# Patient Record
Sex: Female | Born: 2000 | Race: White | Hispanic: No | Smoking: Never smoker
Health system: Southern US, Community
[De-identification: ages and names within clinical notes are randomized; demographics above are authoritative.]

## PROBLEM LIST (undated history)

## (undated) DIAGNOSIS — T8859XA Other complications of anesthesia, initial encounter: Secondary | ICD-10-CM

## (undated) DIAGNOSIS — F32A Depression, unspecified: Secondary | ICD-10-CM

## (undated) DIAGNOSIS — F419 Anxiety disorder, unspecified: Secondary | ICD-10-CM

## (undated) DIAGNOSIS — J069 Acute upper respiratory infection, unspecified: Secondary | ICD-10-CM

## (undated) DIAGNOSIS — F4310 Post-traumatic stress disorder, unspecified: Secondary | ICD-10-CM

## (undated) DIAGNOSIS — D649 Anemia, unspecified: Secondary | ICD-10-CM

## (undated) DIAGNOSIS — L509 Urticaria, unspecified: Secondary | ICD-10-CM

## (undated) DIAGNOSIS — K219 Gastro-esophageal reflux disease without esophagitis: Secondary | ICD-10-CM

## (undated) DIAGNOSIS — F39 Unspecified mood [affective] disorder: Secondary | ICD-10-CM

## (undated) DIAGNOSIS — Z8489 Family history of other specified conditions: Secondary | ICD-10-CM

## (undated) DIAGNOSIS — F909 Attention-deficit hyperactivity disorder, unspecified type: Secondary | ICD-10-CM

## (undated) DIAGNOSIS — Z801 Family history of malignant neoplasm of trachea, bronchus and lung: Secondary | ICD-10-CM

## (undated) DIAGNOSIS — R112 Nausea with vomiting, unspecified: Secondary | ICD-10-CM

## (undated) DIAGNOSIS — J45909 Unspecified asthma, uncomplicated: Secondary | ICD-10-CM

## (undated) DIAGNOSIS — J020 Streptococcal pharyngitis: Secondary | ICD-10-CM

## (undated) DIAGNOSIS — H539 Unspecified visual disturbance: Secondary | ICD-10-CM

## (undated) DIAGNOSIS — Z9889 Other specified postprocedural states: Secondary | ICD-10-CM

## (undated) DIAGNOSIS — Z8669 Personal history of other diseases of the nervous system and sense organs: Secondary | ICD-10-CM

## (undated) DIAGNOSIS — R062 Wheezing: Secondary | ICD-10-CM

## (undated) DIAGNOSIS — Z8051 Family history of malignant neoplasm of kidney: Secondary | ICD-10-CM

## (undated) DIAGNOSIS — E039 Hypothyroidism, unspecified: Secondary | ICD-10-CM

## (undated) DIAGNOSIS — K259 Gastric ulcer, unspecified as acute or chronic, without hemorrhage or perforation: Secondary | ICD-10-CM

## (undated) DIAGNOSIS — R110 Nausea: Secondary | ICD-10-CM

## (undated) DIAGNOSIS — Z8049 Family history of malignant neoplasm of other genital organs: Secondary | ICD-10-CM

## (undated) DIAGNOSIS — Q7962 Hypermobile Ehlers-Danlos syndrome: Secondary | ICD-10-CM

## (undated) DIAGNOSIS — T7840XA Allergy, unspecified, initial encounter: Secondary | ICD-10-CM

## (undated) DIAGNOSIS — E042 Nontoxic multinodular goiter: Secondary | ICD-10-CM

## (undated) DIAGNOSIS — J45991 Cough variant asthma: Secondary | ICD-10-CM

## (undated) DIAGNOSIS — E669 Obesity, unspecified: Secondary | ICD-10-CM

## (undated) DIAGNOSIS — N83209 Unspecified ovarian cyst, unspecified side: Secondary | ICD-10-CM

## (undated) HISTORY — DX: Streptococcal pharyngitis: J02.0

## (undated) HISTORY — DX: Personal history of other diseases of the nervous system and sense organs: Z86.69

## (undated) HISTORY — DX: Cough variant asthma: J45.991

## (undated) HISTORY — DX: Family history of malignant neoplasm of other genital organs: Z80.49

## (undated) HISTORY — DX: Allergy, unspecified, initial encounter: T78.40XA

## (undated) HISTORY — PX: ABCESS DRAINAGE: SHX399

## (undated) HISTORY — DX: Family history of malignant neoplasm of trachea, bronchus and lung: Z80.1

## (undated) HISTORY — DX: Acute upper respiratory infection, unspecified: J06.9

## (undated) HISTORY — DX: Attention-deficit hyperactivity disorder, unspecified type: F90.9

## (undated) HISTORY — DX: Wheezing: R06.2

## (undated) HISTORY — DX: Family history of malignant neoplasm of kidney: Z80.51

## (undated) HISTORY — DX: Urticaria, unspecified: L50.9

---
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
## 2020-01-10 ENCOUNTER — Encounter (INDEPENDENT_AMBULATORY_CARE_PROVIDER_SITE_OTHER): Payer: Self-pay

## 2020-01-18 ENCOUNTER — Ambulatory Visit
Admission: RE | Admit: 2020-01-18 | Discharge: 2020-01-18 | Disposition: A | Discharge status: Home / Self Care | Payer: No Typology Code available for payment source | Source: Ambulatory Visit | Attending: Pediatric Endocrinology | Admitting: Pediatric Endocrinology

## 2020-01-18 ENCOUNTER — Other Ambulatory Visit (INDEPENDENT_AMBULATORY_CARE_PROVIDER_SITE_OTHER): Payer: Self-pay | Admitting: Pediatric Endocrinology

## 2020-01-18 ENCOUNTER — Encounter (INDEPENDENT_AMBULATORY_CARE_PROVIDER_SITE_OTHER): Payer: Self-pay

## 2020-01-18 DIAGNOSIS — N838 Other noninflammatory disorders of ovary, fallopian tube and broad ligament: Secondary | ICD-10-CM | POA: Diagnosis not present

## 2020-01-18 DIAGNOSIS — D270 Benign neoplasm of right ovary: Secondary | ICD-10-CM

## 2020-01-18 DIAGNOSIS — N83201 Unspecified ovarian cyst, right side: Secondary | ICD-10-CM | POA: Diagnosis not present

## 2020-01-18 DIAGNOSIS — N83202 Unspecified ovarian cyst, left side: Secondary | ICD-10-CM

## 2020-01-18 DIAGNOSIS — N83292 Other ovarian cyst, left side: Secondary | ICD-10-CM | POA: Diagnosis not present

## 2020-01-18 NOTE — Telephone Encounter (Signed)
Can you please follow up on the referral I sent last week? Thanks

## 2020-01-19 ENCOUNTER — Other Ambulatory Visit (INDEPENDENT_AMBULATORY_CARE_PROVIDER_SITE_OTHER): Payer: Self-pay | Admitting: Pediatric Endocrinology

## 2020-01-19 ENCOUNTER — Encounter (INDEPENDENT_AMBULATORY_CARE_PROVIDER_SITE_OTHER): Payer: Self-pay

## 2020-01-19 DIAGNOSIS — N9489 Other specified conditions associated with female genital organs and menstrual cycle: Secondary | ICD-10-CM

## 2020-01-19 NOTE — Progress Notes (Signed)
Follow up u/s of the Pelvis-  Good resolution of hemorrhagic cyst Rapid progression of right adnexal mass from incidental finding 6 weeks ago to significant effacement of ovary  MRI Pelvis ordered for evaluation.  She is scheduled to see Dr. Kennon Rounds in 3 weeks- hopefully will be able to have MRI done prior to that visit.

## 2020-01-24 ENCOUNTER — Encounter (INDEPENDENT_AMBULATORY_CARE_PROVIDER_SITE_OTHER): Payer: Self-pay

## 2020-01-26 ENCOUNTER — Encounter (INDEPENDENT_AMBULATORY_CARE_PROVIDER_SITE_OTHER): Payer: Self-pay

## 2020-01-27 ENCOUNTER — Encounter (INDEPENDENT_AMBULATORY_CARE_PROVIDER_SITE_OTHER): Payer: Self-pay

## 2020-01-27 ENCOUNTER — Telehealth (INDEPENDENT_AMBULATORY_CARE_PROVIDER_SITE_OTHER): Payer: Self-pay | Admitting: Pediatric Endocrinology

## 2020-01-27 NOTE — Telephone Encounter (Signed)
Who's calling (name and relationship to patient) : [facility]  Best contact number: [phone] ext [phone]  Provider they see: Dr. Baldo Ash  Reason for call:  Nira Conn called in stating that per [facility] a PA is in fact needed for the MRI with and without contrast. States that if pt had [facility] then there would not be a need for the PA but after confirmation with Payer Chat it does require that. CPT code for reference is [phone]. PA is needed today to ensure PT stays on schedule for MRI that is scheduled for tomorrow 8/14, if not will have to be rescheduled for later time. Requesting call back at [phone] ext [phone].   Call ID:      PRESCRIPTION REFILL ONLY  Name of prescription:  Pharmacy:

## 2020-01-27 NOTE — Telephone Encounter (Signed)
Contacted patient insurance to get it authorized over the phone as we do not have access to the online portal required for this insurance.   Prior authorization for this insurance must be done by fax if unable to do so through the online portal. Given the fax number [phone] and instructed to write Expedited on the cover sheet.   attatched to the fax was the order for CPT 72197 with Dx code [phone] Mass of Uterine Adnexa. Patient's recent Ultrasound and the provider results from it, previous visit note, and a letter requesting it be expedited with provider NPI and tax ID the imaging centers NPI and tax ID, and a number to contact if they need any further information.   As this is an expedited request will follow up with the authorization again later today so in imaging center can be updated.

## 2020-01-30 ENCOUNTER — Ambulatory Visit (HOSPITAL_COMMUNITY): Admission: RE | Admit: 2020-01-30 | Payer: BLUE CROSS/BLUE SHIELD | Source: Ambulatory Visit

## 2020-02-02 ENCOUNTER — Ambulatory Visit (HOSPITAL_COMMUNITY)
Admission: RE | Admit: 2020-02-02 | Discharge: 2020-02-02 | Disposition: A | Discharge status: Home / Self Care | Payer: BLUE CROSS/BLUE SHIELD | Source: Ambulatory Visit | Attending: Pediatric Endocrinology | Admitting: Pediatric Endocrinology

## 2020-02-02 ENCOUNTER — Telehealth (INDEPENDENT_AMBULATORY_CARE_PROVIDER_SITE_OTHER): Payer: Self-pay | Admitting: Pediatric Endocrinology

## 2020-02-02 ENCOUNTER — Telehealth (INDEPENDENT_AMBULATORY_CARE_PROVIDER_SITE_OTHER): Payer: BLUE CROSS/BLUE SHIELD | Admitting: Lactation Services

## 2020-02-02 ENCOUNTER — Other Ambulatory Visit: Payer: Self-pay

## 2020-02-02 DIAGNOSIS — N9489 Other specified conditions associated with female genital organs and menstrual cycle: Secondary | ICD-10-CM | POA: Diagnosis not present

## 2020-02-02 DIAGNOSIS — N946 Dysmenorrhea, unspecified: Secondary | ICD-10-CM

## 2020-02-02 DIAGNOSIS — R1909 Other intra-abdominal and pelvic swelling, mass and lump: Secondary | ICD-10-CM | POA: Diagnosis not present

## 2020-02-02 DIAGNOSIS — N83291 Other ovarian cyst, right side: Secondary | ICD-10-CM | POA: Diagnosis not present

## 2020-02-02 MED ORDER — GADOBENATE DIMEGLUMINE 529 MG/ML IV SOLN
10.0000 mL | Freq: Once | INTRAVENOUS | Status: AC | PRN
  Administered 2020-02-02: 10 mL via INTRAVENOUS

## 2020-02-02 NOTE — Telephone Encounter (Signed)
  Who's calling (name and relationship to patient) : Dr. Virginia Crews office  Best contact number: [phone]  Provider they see: Dr. Baldo Ash  Reason for call: Dr. Virginia Crews office left a voicemail wanting to let you know that patient has an appointment in their OBGYN office on 8/25 at 2:15.    PRESCRIPTION REFILL ONLY  Name of prescription:  Pharmacy:

## 2020-02-02 NOTE — Telephone Encounter (Signed)
Roselyn Reef, CMA at Dr. Montey Hora office called to ask when patient will be scheduled in the office. patient is scheduled for an appointment on 8/25 @ 10:15 with Dr. Kennon Rounds.   Called Dr. Montey Hora office and left message with referral coordinator that pt has appt scheduled.   Spoke with Heidi [name], [name]'s mom and she reports she is aware of the appointment next week.

## 2020-02-07 ENCOUNTER — Encounter (INDEPENDENT_AMBULATORY_CARE_PROVIDER_SITE_OTHER): Payer: Self-pay

## 2020-02-08 ENCOUNTER — Other Ambulatory Visit: Payer: Self-pay

## 2020-02-08 ENCOUNTER — Encounter (INDEPENDENT_AMBULATORY_CARE_PROVIDER_SITE_OTHER): Payer: Self-pay

## 2020-02-08 ENCOUNTER — Ambulatory Visit (INDEPENDENT_AMBULATORY_CARE_PROVIDER_SITE_OTHER): Payer: BLUE CROSS/BLUE SHIELD | Admitting: Family Medicine

## 2020-02-08 ENCOUNTER — Encounter: Payer: Self-pay | Admitting: Family Medicine

## 2020-02-08 VITALS — BP 111/75 | HR 103 | Ht 64.0 in | Wt 292.0 lb

## 2020-02-08 DIAGNOSIS — F640 Transsexualism: Secondary | ICD-10-CM | POA: Diagnosis not present

## 2020-02-08 DIAGNOSIS — N946 Dysmenorrhea, unspecified: Secondary | ICD-10-CM

## 2020-02-08 DIAGNOSIS — R102 Pelvic and perineal pain: Secondary | ICD-10-CM | POA: Diagnosis not present

## 2020-02-08 NOTE — Assessment & Plan Note (Addendum)
Patient at this time is not transgender and has potentially desire for children in the future.  Initially there was a discussion around surgical removal of both ovaries and they and their mother were considering oocyte cryo-preservation if needed.  Patient has an indwelling GnRH agonist which suppresses their ovarian function at this time.  They might be interested in testosterone supplementation as well.  Will reach out to their endocrinologist for consideration of long-term plans and bone preservation.

## 2020-02-08 NOTE — Progress Notes (Signed)
Subjective:    Patient ID: Heidi [name] is a 19 y.o. adult presenting with Follow-up  on 02/08/2020  HPI: Has history of hemorrhagic cyst initially noted in May 2020.  There is a moderate amount of free fluid noted at that time as well.  This hemorrhagic cyst was resolved by December 09, 2018.  Second hemorrhagic cyst noted in June 2021 which resolved in August 2021.  There was a question of a mass noted in the left adnexa and so an MRI was subsequently obtained.  This MRI failed to show a pelvic mass and that was thought to be overlying sigmoid colon.  There was a very small paratubal cyst noted measuring 1.1 cm.  Heidi [name] has noticed worsening pelvic pain which is not cyclical and is present every day.  It is worse with any type of movement.  Heidi [name] also has a implanted Abanda agonist and since November 2020.  Review of Systems  Constitutional: Negative for chills and fever.  Respiratory: Negative for shortness of breath.   Cardiovascular: Negative for chest pain.  Gastrointestinal: Negative for abdominal pain, nausea and vomiting.  Genitourinary: Negative for dysuria.  Skin: Negative for rash.      Objective:    BP 111/75   Pulse (!) 103   Ht 5\' 4"  (1.626 m)   Wt 292 lb (132.5 kg)   BMI 50.12 kg/m  Physical Exam Exam conducted with a chaperone present.  Constitutional:      General: Heidi [name] "Heidi [name]" is not in acute distress.    Appearance: Heidi [name] "Heidi [name]" is well-developed.  HENT:     Head: Normocephalic and atraumatic.  Eyes:     General: No scleral icterus. Cardiovascular:     Rate and Rhythm: Normal rate.  Pulmonary:     Effort: Pulmonary effort is normal.  Abdominal:     Palpations: Abdomen is soft.  Genitourinary:    Comments: Vagina is atrophic, uterus is markedly small, ovaries cannot be palpated there is no real pelvic tenderness on exam, exam is limited by body habitus Musculoskeletal:     Cervical back: Neck supple.  Skin:    General: Skin is warm and dry.    Neurological:     Mental Status: Heidi [name] "Heidi [name]" is alert and oriented to person, place, and time.         Assessment & Plan:   Problem List Items Addressed This Visit      Unprioritized   Dysmenorrhea in adolescent    And menorrhagia when their ovaries are allowed to function on her own.  Has already been on combined oral contraceptives, Nexplanon to no avail.      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  Initially there was a discussion around surgical removal of both ovaries and they and their mother were considering oocyte cryo-preservation if needed.  Patient has an indwelling GnRH agonist which suppresses their ovarian function at this time.  They might be interested in testosterone supplementation as well.  Will reach out to their endocrinologist for consideration of long-term plans and bone preservation.      Pelvic pain - Primary    This is extremely unusual for a paratubal cyst to cause pain.  Given that we are suppressing their normal ovarian function to the point of having no menses times greater than 1 year, it seems unlikely that they would have some sort of endometriosis  or other chronic pelvic pain type condition as a reason for their pain.  Given all of this, I have referred them to urology for possible interstitial cystitis work-up. If this fails to identify the cause of their pain, would consider diagnostic laparoscopy with removal of paratubal cyst and evaluation of ovaries at that time.      Relevant Orders   Ambulatory referral to Urology     Total time in review of prior notes, pathology, labs, history taking, review with patient, exam, note writing, discussion of options, plan for next steps, alternatives and risks of treatment: 81 minutes.  Return in about 3 months (around 05/10/2020) for a follow-up, referral to urology.  Heidi [name] 02/08/2020 4:27  PM

## 2020-02-08 NOTE — Progress Notes (Signed)
Assisted Dr. Kennon Rounds with Manual Exam-02/08/20@ 11: 20a

## 2020-02-08 NOTE — Assessment & Plan Note (Addendum)
This is extremely unusual for a paratubal cyst to cause pain.  Given that we are suppressing their normal ovarian function to the point of having no menses times greater than 1 year, it seems unlikely that they would have some sort of endometriosis or other chronic pelvic pain type condition as a reason for their pain.  Given all of this, I have referred them to urology for possible interstitial cystitis work-up. If this fails to identify the cause of their pain, would consider diagnostic laparoscopy with removal of paratubal cyst and evaluation of ovaries at that time.

## 2020-02-08 NOTE — Assessment & Plan Note (Signed)
And menorrhagia when their ovaries are allowed to function on her own.  Has already been on combined oral contraceptives, Nexplanon to no avail.

## 2020-02-19 DIAGNOSIS — M7989 Other specified soft tissue disorders: Secondary | ICD-10-CM | POA: Diagnosis not present

## 2020-02-19 DIAGNOSIS — S61451A Open bite of right hand, initial encounter: Secondary | ICD-10-CM | POA: Diagnosis not present

## 2020-02-19 DIAGNOSIS — W540XXA Bitten by dog, initial encounter: Secondary | ICD-10-CM | POA: Diagnosis not present

## 2020-02-19 IMAGING — US US PELVIS COMPLETE WITH TRANSVAGINAL
2 series · 13 of 25 positions shown · non-contrast
Comparison: None

CLINICAL DATA: Initial evaluation for primary amenorrhea.



[Series 1: us pelvis complete with transvaginal · 0.28mm/px · 59 acquisitions, 12 frames shown (1 of 2)]
[im 1/59]
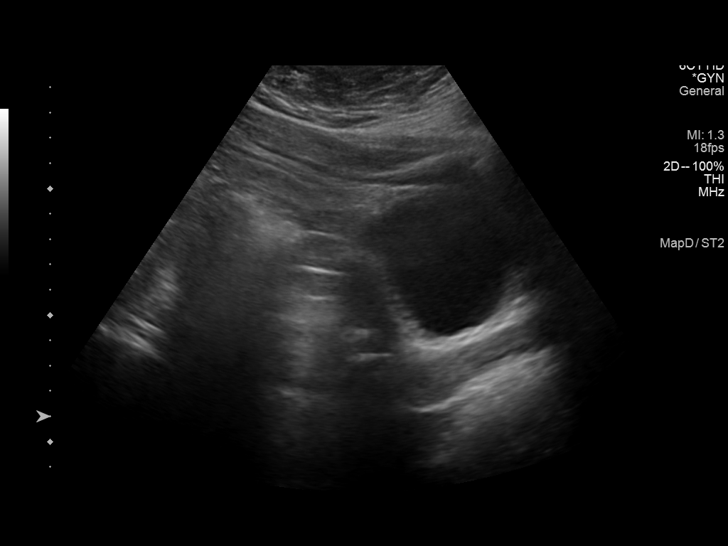
[im 6/59]
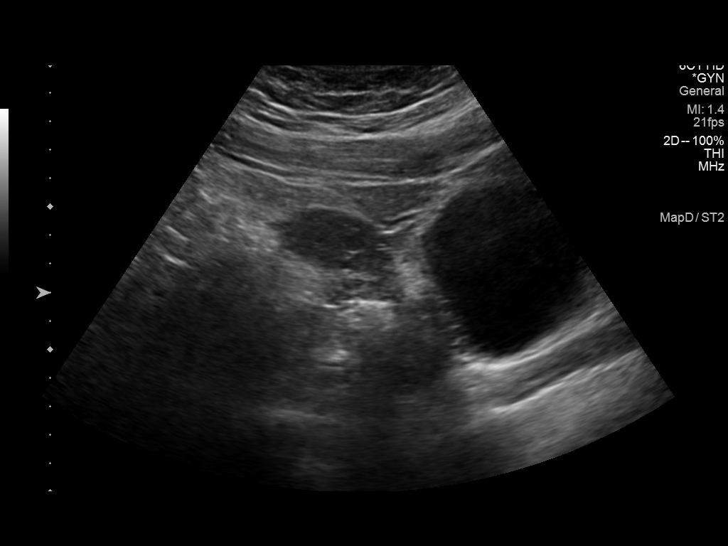
[im 11/59]
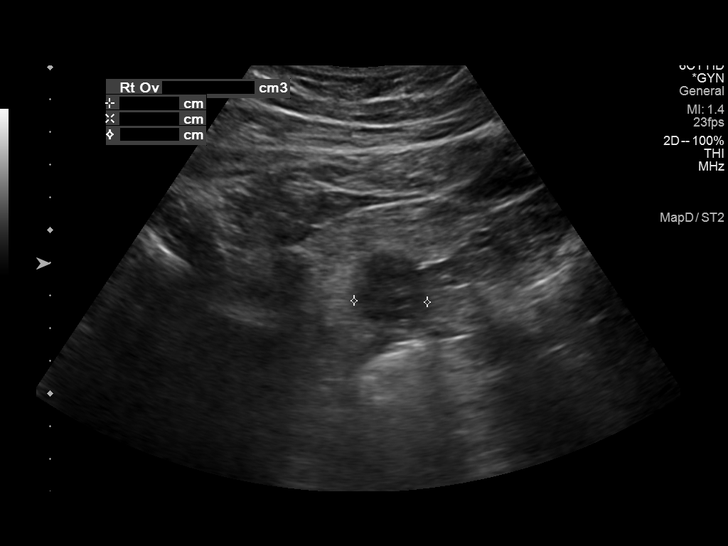
[im 16/59]
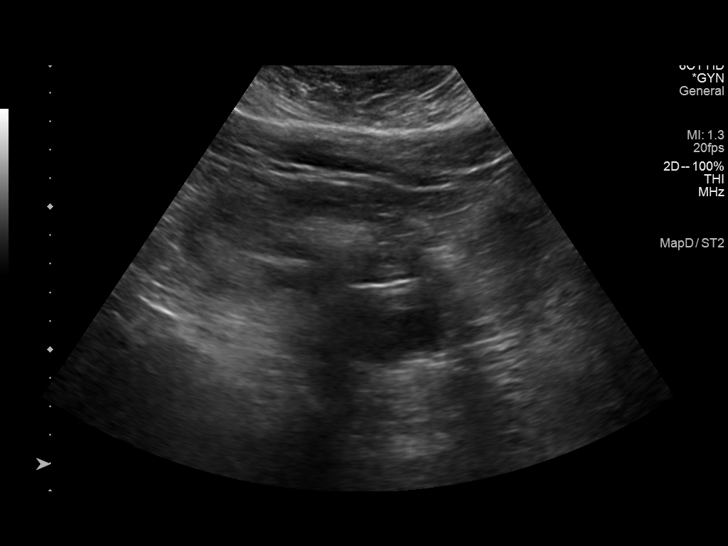
[im 21/59]
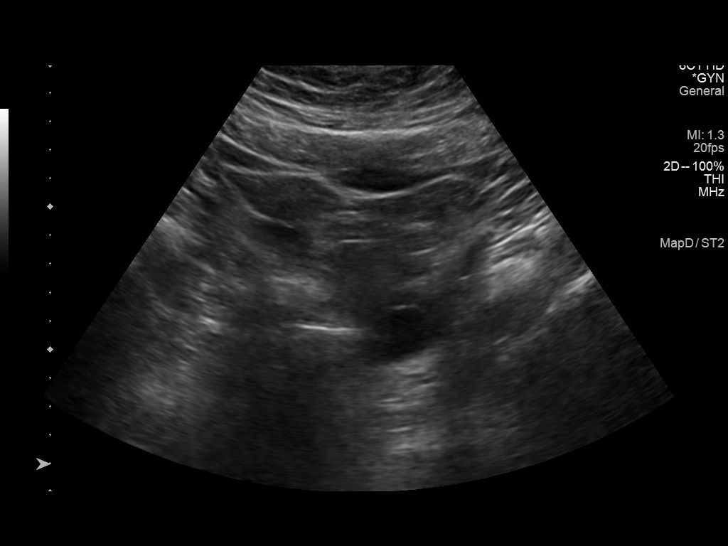
[im 26/59]
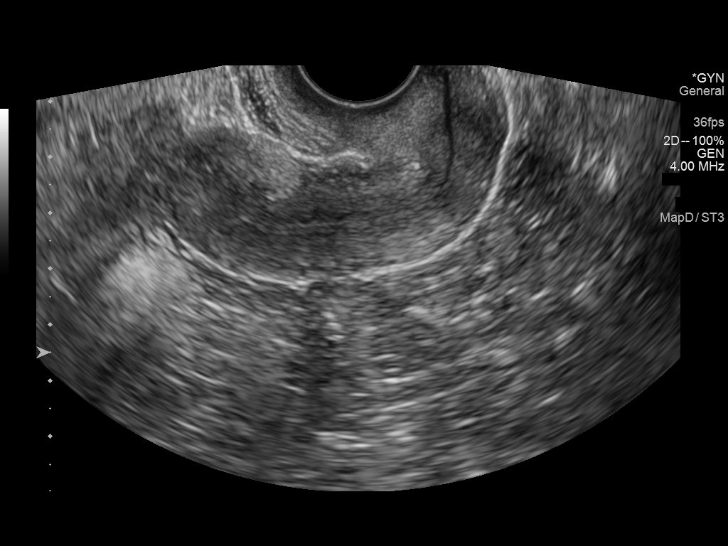
[im 31/59]
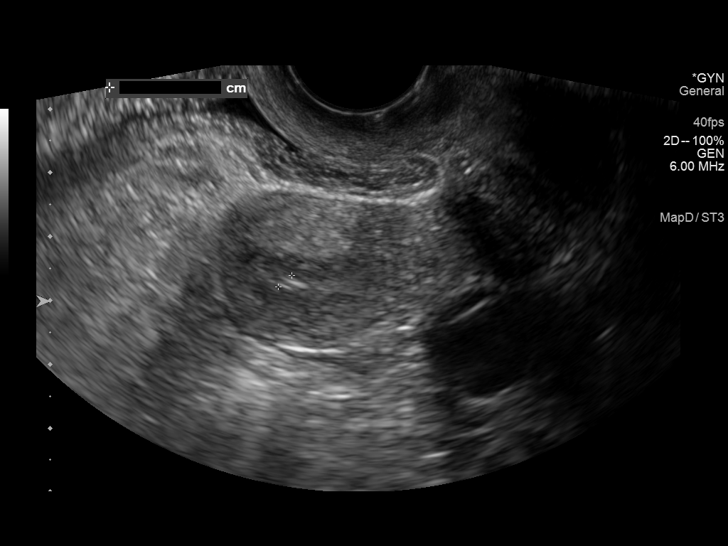
[im 36/59]
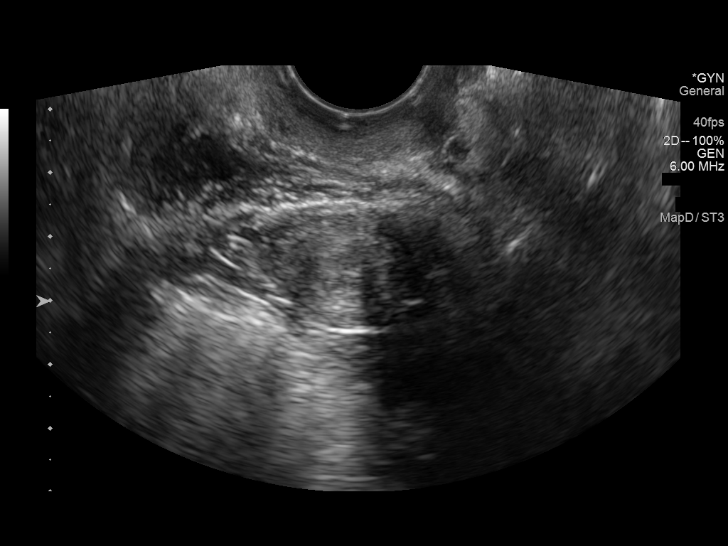
[im 41/59]
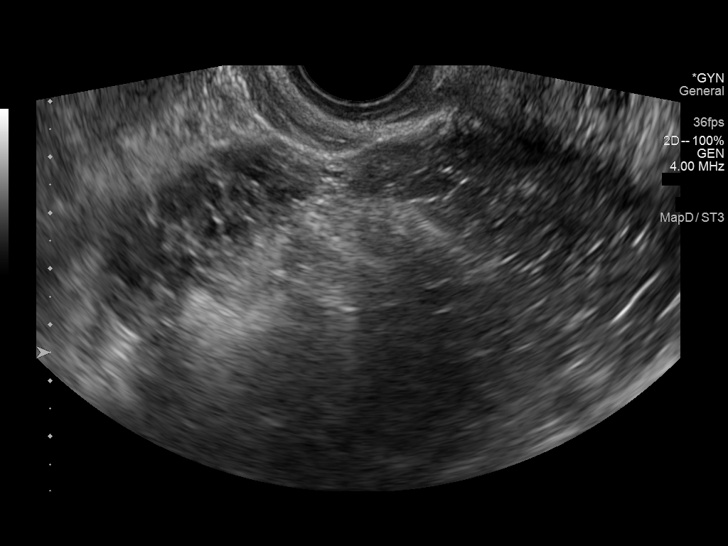
[im 46/59]
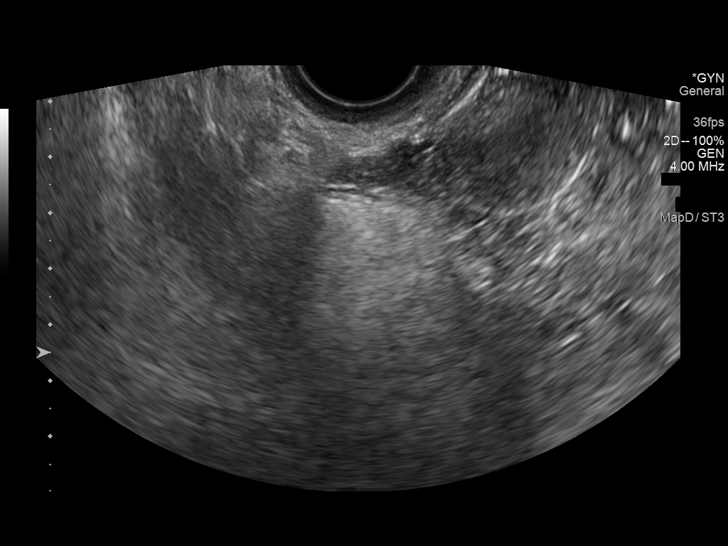
[im 51/59]
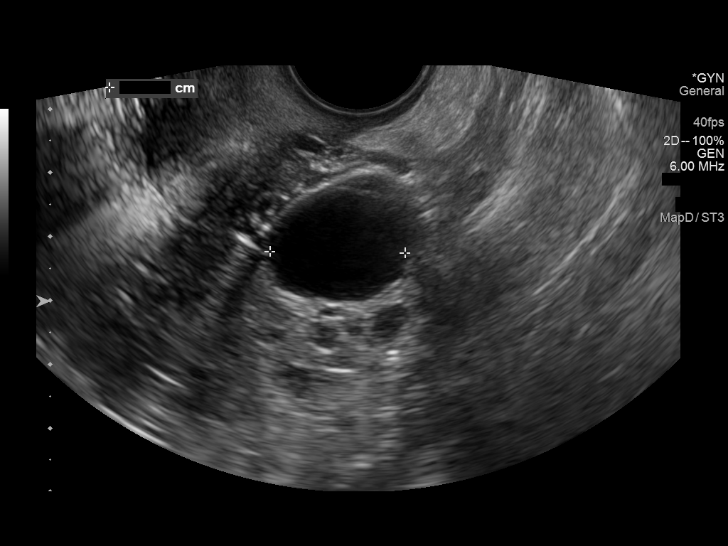
[im 56/59]
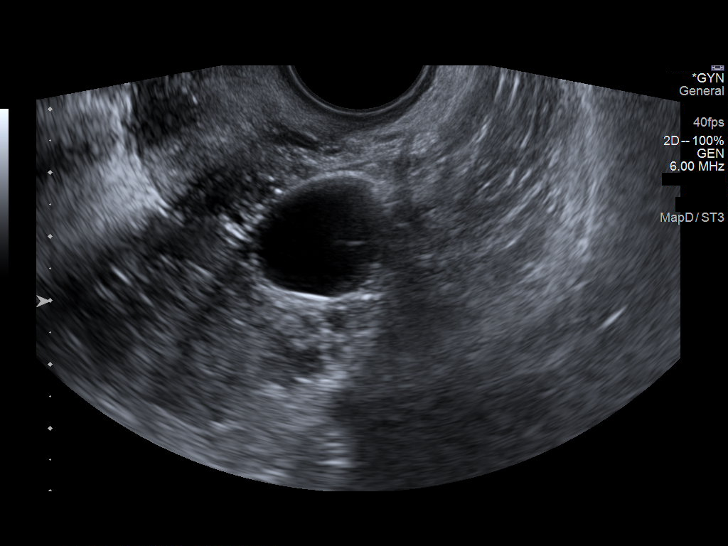

[Series 1001: us pelvis complete with transvaginal · 0.11mm/px · 1 of 3 slices shown (2 of 2)]
[im 1/3]
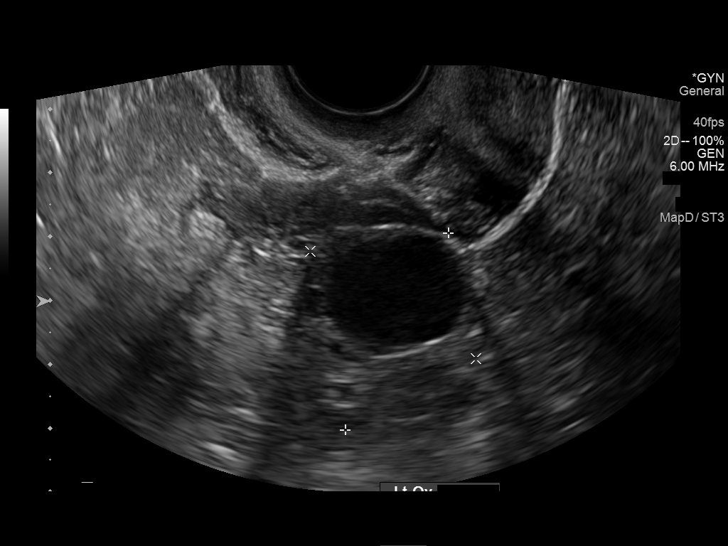

[13 of 25 positions shown; findings below may reference images not displayed]

FINDINGS: Uterus

Measurements: 6.9 x 2.5 x 3.8 cm = volume: 34.4 mL. No fibroids or
other mass visualized.

Endometrium

Thickness: 2.8 mm.  No focal abnormality visualized.

Right ovary

Measurements: 4.2 x 1.9 x 1.9 cm = volume: 7.8 mL. Normal
appearance/no adnexal mass.

Left ovary

Measurements: 3.5 x 3.1 x 2.9 cm = volume: 16.4 mL. 1.8 x 1.6 x
cm simple cyst, most consistent with a normal physiologic follicular
cyst/dominant follicle.

Other findings

No abnormal free fluid.
IMPRESSION: Normal pelvic ultrasound.

## 2020-02-21 ENCOUNTER — Telehealth: Payer: Self-pay

## 2020-02-22 NOTE — Telephone Encounter (Signed)
[facility] to notify of referral to be sent. [place] office notified to send needed information including recent provider note and imaging. MyChart message sent to pt to expect a phone call from [facility] with new pt appt.

## 2020-03-01 DIAGNOSIS — R102 Pelvic and perineal pain: Secondary | ICD-10-CM | POA: Diagnosis not present

## 2020-03-05 ENCOUNTER — Other Ambulatory Visit: Payer: Self-pay

## 2020-03-05 ENCOUNTER — Ambulatory Visit (INDEPENDENT_AMBULATORY_CARE_PROVIDER_SITE_OTHER): Payer: BLUE CROSS/BLUE SHIELD | Admitting: Pediatric Endocrinology

## 2020-03-05 ENCOUNTER — Encounter (INDEPENDENT_AMBULATORY_CARE_PROVIDER_SITE_OTHER): Payer: Self-pay | Admitting: Pediatric Endocrinology

## 2020-03-05 VITALS — BP 118/78 | HR 80 | Wt 286.6 lb

## 2020-03-05 DIAGNOSIS — E890 Postprocedural hypothyroidism: Secondary | ICD-10-CM

## 2020-03-05 DIAGNOSIS — F640 Transsexualism: Secondary | ICD-10-CM

## 2020-03-05 DIAGNOSIS — E8881 Metabolic syndrome: Secondary | ICD-10-CM | POA: Diagnosis not present

## 2020-03-05 DIAGNOSIS — F4322 Adjustment disorder with anxiety: Secondary | ICD-10-CM | POA: Diagnosis not present

## 2020-03-05 LAB — POCT GLUCOSE (DEVICE FOR HOME USE): Glucose Fasting, POC: 99 mg/dL (ref 70–99)

## 2020-03-05 LAB — POCT GLYCOSYLATED HEMOGLOBIN (HGB A1C): Hemoglobin A1C: 5.9 % — AB (ref 4.0–5.6)

## 2020-03-05 MED ORDER — LETROZOLE 2.5 MG PO TABS: 2.5000 mg | ORAL_TABLET | Freq: Every day | ORAL | 6 refills | Status: AC

## 2020-03-05 NOTE — Patient Instructions (Addendum)
Questions to consider:  1) which side of androgeny? Female vs female. 2) if Testosterone has to be given by injection- would that affect your decision? 3) If you have your ovaries removed- how will this impact potential for later family creation? 4) If you have your breasts removed- how will this impact your ideal parenting later in life?  Goals  1) connect with a therapist and schedule an appointment.  2) Continue to limit sugar drinks to once a week. 3) Work on finding ways to move your body daily   [email or web address].com/us/therapists/[email or web address]-[email or web address][email or web address]  Start Letrozole 1 tab once a day.

## 2020-03-05 NOTE — Progress Notes (Signed)
Subjective:  Subjective  Patient Name: Heidi [name] Date of Birth: [birth year]  MRN: [id number]  Heidi [name]  Presents to our office today for follow up evaluation and management of Heidi [name] "Heidi [name]"'s weight gain, abnormal menses. They has post surgical hypothyroidism. (thyroidectomy for multinodular goiter with fm hx PTC)   HISTORY OF PRESENT ILLNESS:   Heidi [name] is a 19 y.o. Caucasian female   Heidi [name] was accompanied by their mom   1. Heidi [name] was seen by their adolescent medicine specialist and their neurologist in the fall of 2019. They was having issues with weight gain, dysmenorrhea, lipid abnormalities, and migraines. 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    2 Heidi [name] was last seen in pediatric endocrine clinic on 11/29/19. In the interim they have been doing better.  Heidi [name] saw Heidi [name] to discuss options for oophorectomy. Heidi [name] was concerned about life expectancy without sex steroids. Heidi [name] is frustrated by continued ovarian cysts.   They are scheduled for cystography and pelvic PT to see if that would help with pelvic pain.   ______  They had Chairi repair - decompression and laminectomy of C1. Since then the episodic migraines have resolved.  Heidi [name] is so happy to no longer have headaches.   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   S/P SOC for decompression, C1 laminectomy, duraplasty (09/30/2019). Done by Heidi [name] at [facility] They had their thyroidectomy on 01/26/19 with Heidi [name].  They had a Supprelin implant 04/25/19.   They have not had any more periods since their implant.   They are currently having low pelvic pain that feels crampy but lower. They are concerned that it may be another cyst.   Dysphoria remains due to breasts. They were denied for a reduction by insurance (mom talked them down from getting full female reconstruction).   They have continued on 150 mcg of  Synthroid since last visit. They feel that it is working well.   Sleep is good. Energy level is good.   Appetite has calmed down. They are drinking 1 sugar drink per week- sometimes less. They are working on limiting sugar intake.   Dad is in the hospital from [place].   They are not currently working with a therapist. Discussed options for supportive therapists who take Medicaid.   3. Pertinent Review of Systems:  Constitutional: The patient feels "ok". The patient seems healthy and active. They were tearful during visit today.  Eyes: Vision seems to be good. There are no recognized eye problems.  Neck: The patient has no complaints of anterior neck swelling, soreness, tenderness, pressure, discomfort, or difficulty swallowing.  Has scar on neck.  Heart: Heart rate increases with exercise or other physical activity. The patient has no complaints of palpitations, irregular heart beats, chest pain, or chest pressure.   Lungs: + asthma- but on controller medication.  Gastrointestinal: Bowel movents seem normal. The patient has no complaints of excessive hunger, upset stomach, stomache aches or pains, diarrhea, or constipation. Legs: Muscle mass and strength seem normal. There are no complaints of numbness, tingling, burning, or pain. No edema is noted.  Feet: There are no obvious foot problems. There are no complaints of numbness, tingling, burning, or pain. No edema is noted. Neurologic: There are no recognized problems with muscle movement and strength, sensation, or coordination. GYN/GU: Supprelin 04/25/19.   PAST MEDICAL, FAMILY, AND SOCIAL HISTORY   Past Medical History:  Diagnosis  Date  . ADHD (attention deficit hyperactivity disorder)   . Allergy    cats,  . Cough variant asthma   . Episodic mood disorder ([place])   . Family history of adverse reaction to anesthesia    mother was once combative and had PONV  . Family history of kidney cancer   . Family history of lung cancer   .  Family history of uterine cancer   . Gastric ulcer   . Hemorrhagic ovarian cyst    Left hemorrhagic ovarian cyst per mother  . History of Chiari malformation    decompression 09/30/19  . Multinodular thyroid   . Obesity   . Recurrent upper respiratory infection (URI)     Family History  Problem Relation Age of Onset  . Diabetes Father   . Depression Father   . Heart disease Father        3 MI, triple bipass  . Hyperlipidemia Father   . Hypertension Father   . Learning disabilities Father        ADD/ADHD  . Mental illness Father        bipolar  . Vision loss Father        lens replacement surgery  . Allergic rhinitis Father   . Anxiety disorder Father   . Bipolar disorder Father   . ADD / ADHD Father   . Asthma Mother   . Arthritis Mother   . Depression Mother   . Allergic rhinitis Mother   . Urticaria Mother   . Thyroid cancer Mother 66       papillary  . Asthma Brother   . Learning disabilities Brother        disorder of written expression  . Allergic rhinitis Brother   . ADD / ADHD Brother   . Kidney disease Maternal Grandmother   . Miscarriages / Stillbirths Maternal Grandmother   . Kidney cancer Maternal Grandmother 68       "transitional cell"  . Alcohol abuse Maternal Grandfather   . Mental illness Maternal Grandfather        Alzheimers, Vascular Damention  . Arthritis Paternal Grandmother   . COPD Paternal Grandmother   . COPD Paternal Grandfather   . Immunodeficiency Paternal Grandfather   . Drug abuse Maternal Uncle   . Early death Maternal Uncle   . Migraines Cousin   . Kidney cancer Other        diagnosed in his 62s  . Lung cancer Maternal Uncle 56       rare subtype of small cell lung cancer  . Thyroid cancer Maternal Great-grandmother 17  . Uterine cancer Maternal Great-grandmother 22  . Birth defects Neg Hx   . Hearing loss Neg Hx   . Mental retardation Neg Hx   . Stroke Neg Hx   . Varicose Veins Neg Hx   . Angioedema Neg Hx   . Eczema  Neg Hx   . Seizures Neg Hx   . Schizophrenia Neg Hx   . Autism Neg Hx      Current Outpatient Medications:  .  albuterol (VENTOLIN HFA) 108 (90 Base) MCG/ACT inhaler, Inhale 2 puffs into the lungs every 4 (four) hours as needed., Disp: 18 g, Rfl: 1 .  Cholecalciferol (D3-1000) 1000 units tablet, Take 2,000 Units by mouth at bedtime. , Disp: , Rfl:  .  cloNIDine HCl (KAPVAY) 0.1 MG TB12 ER tablet, Take 0.2 mg by mouth daily. , Disp: , Rfl: 1 .  levothyroxine (SYNTHROID) 150 MCG tablet, TAKE  1 TABLET (150 MCG TOTAL) BY MOUTH DAILY AT 6 (SIX) AM., Disp: 30 tablet, Rfl: 5 .  loperamide (IMODIUM) 2 MG capsule, Take 2 mg by mouth as needed for diarrhea or loose stools., Disp: , Rfl:  .  Multiple Vitamin (MULTIVITAMIN) LIQD, Take 5 mLs by mouth daily., Disp: , Rfl:  .  oxcarbazepine (TRILEPTAL) 600 MG tablet, Take 900 mg by mouth 2 (two) times daily. , Disp: , Rfl: 1 .  pantoprazole (PROTONIX) 40 MG tablet, Take 1 tablet (40 mg total) by mouth daily before breakfast., Disp: 90 tablet, Rfl: 3 .  VYVANSE 70 MG capsule, Take 70 mg by mouth daily., Disp: , Rfl: 0 .  Coenzyme Q10 (COQ10) 100 MG CAPS, Take 100 mg by mouth 2 (two) times daily. (Patient not taking: Reported on 03/05/2020), Disp: 60 each, Rfl: 2 .  fluticasone (FLOVENT HFA) 44 MCG/ACT inhaler, Inhale 2 puffs into the lungs 2 (two) times daily. (Patient not taking: Reported on 03/05/2020), Disp: 1 Inhaler, Rfl: 5 .  ibuprofen (ADVIL) 600 MG tablet, Take 1 tablet (600 mg total) by mouth every 8 (eight) hours as needed for mild pain or moderate pain. Do not combine with any other form of NSAID. Do not take with Tylenol. (Patient not taking: Reported on 11/29/2019), Disp: 30 tablet, Rfl: 0 .  KARBINAL ER 4 MG/5ML SUER, TAKE 7 AND 1/2 MLS TO 20 MLS (6MG TO 16 MG) BY MOUTH 2 TIMES DAILY AS NEEDED. (Patient not taking: Reported on 03/05/2020), Disp: 480 mL, Rfl: 3 .  letrozole (FEMARA) 2.5 MG tablet, Take 1 tablet (2.5 mg total) by mouth daily., Disp: 30  tablet, Rfl: 6 .  multivitamin-iron-minerals-folic acid (CENTRUM) chewable tablet, Chew 2 tablets by mouth daily.  (Patient not taking: Reported on 11/29/2019), Disp: , Rfl:   Allergies as of 03/05/2020 - Review Complete 03/05/2020  Allergen Reaction Noted  . Other Other (See Comments) 10/22/2010  . Sulfa antibiotics Other (See Comments) 01/13/2017     reports that [name] "Heidi [name]" has never smoked. [name] "Heidi [name]" has never used smokeless tobacco. [name] "Heidi [name]" reports that [employer] "Heidi [name]" does not drink alcohol and does not use drugs. Pediatric History  Patient Parents  . Edell,Laura (Mother)   Other Topics Concern  . Not on file  Social History Narrative   Reid is a 11th grade at [employer]; has not been to school in over a month. 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 She lives with her parents. She enjoys playing with friends online, drawing, and YouTube.       504 in school.       Sees Heidi. Darleene Cleaver      Identifies at [place]   Prefers pronouns- they/them   1. School and Family:  They have graduated high school 2. Activities: not active  3. Primary Care Provider: Leveda Anna, NP  ROS: There are no other significant problems involving Lindy's other body systems.    Objective:  Objective  Vital Signs:    BP 118/78   Pulse 80   Wt 286 lb 9.6 oz (130 kg)   BMI 49.19 kg/m   Blood pressure percentiles are not available for patients who are 18 years or older.  Ht Readings from Last 3 Encounters:  02/08/20 5' 4" (1.626 m) (46 %, Z= -0.10)*  11/22/19 5' 3" (1.6 m) (31 %, Z= -0.49)*  10/11/19 5' 3" (1.6 m) (31 %, Z= -0.49)*   * Growth percentiles are based on CDC (  Girls, 2-20 Years) data.   Wt Readings from Last 3 Encounters:  03/05/20 286 lb 9.6 oz (130 kg) (>99 %, Z= 2.70)*  02/08/20 292 lb (132.5 kg) (>99 %, Z= 2.72)*  11/29/19 280 lb (127 kg) (>99 %, Z= 2.65)*   * Growth percentiles are based on CDC (Girls, 2-20 Years) data.    HC Readings from Last 3 Encounters:  No data found for [facility]   Body surface area is 2.42 meters squared. No height on file for this encounter. >99 %ile (Z= 2.70) based on CDC (Girls, 2-20 Years) weight-for-age data using vitals from 03/05/2020.   PHYSICAL EXAM:    Gen:  NAD. Comfortably talking with no agitation or distress. Weight is decreased from last visit HEENT: moist mucous membranes.  Normal dentition. Thyroid Scar is healing well. No erythema.  Posterior laminectomy scar also healing well.  CV: normal pulses and distal perfusion PULM: normal work of breathing ABD: soft/nontender/nondistended/normal bowel sounds. Enlarged for age.  EXT: No edema. Well healing scar from abscess on right thigh.  Neuro: Alert and oriented x3 Skin: +1 acanthosis on posterior neck.   LAB DATA:     Lab Results  Component Value Date   HGBA1C 5.9 (A) 03/05/2020   HGBA1C 5.7 (A) 11/29/2019   HGBA1C 5.9 (A) 01/11/2019   HGBA1C 5.7 (A) 09/06/2018   HGBA1C 5.7 (H) 03/19/2018   HGBA1C 5.4 03/06/2016    Results for orders placed or performed in visit on 03/05/20  POCT glycosylated hemoglobin (Hb A1C)  Result Value Ref Range   Hemoglobin A1C 5.9 (A) 4.0 - 5.6 %   HbA1c POC (<> result, manual entry)     HbA1c, POC (prediabetic range)     HbA1c, POC (controlled diabetic range)    POCT Glucose (Device for Home Use)  Result Value Ref Range   Glucose Fasting, POC 99 70 - 99 mg/dL   POC Glucose       Pelvic Ultrasound 11/02/18. IMPRESSION: 2.8 cm complex left ovarian cyst is noted which may represent hemorrhagic cyst. Short-interval follow up ultrasound in 6-12 weeks is recommended, preferably during the week following the patient's normal menses.  Mild to moderate amount of free fluid is noted in the pelvis which may be physiologic or potentially represent ruptured ovarian cyst.  There is no evidence of ovarian torsion.    Pelvic Ultrasound 12/09/18 FINDINGS: Uterus Measurements: 6.9 x  2.5 x 3.8 cm = volume: 34.4 mL. No fibroids or other mass visualized.  Endometrium Thickness: 2.8 mm.  No focal abnormality visualized.  Right ovary Measurements: 4.2 x 1.9 x 1.9 cm = volume: 7.8 mL. Normal appearance/no adnexal mass.  Left ovary Measurements: 3.5 x 3.1 x 2.9 cm = volume: 16.4 mL. 1.8 x 1.6 x 2.1 cm simple cyst, most consistent with a normal physiologic follicular cyst/dominant follicle.  Other findings No abnormal free fluid.  IMPRESSION: Normal pelvic ultrasound.  Thyroid ultrasound 12/09/18 IMPRESSION: Abnormal appearance of the thyroid gland with both the right, and to a lesser extent, the left lobes of the thyroid replaced with predominantly colloid containing cysts. While none of the individual nodules/cysts meet imaging criteria to recommend percutaneous sampling or continued dedicated follow-up, given patient's young age and family history of thyroid cancer, potential imaging strategies could include proceeding with random ultrasound-guided fine-needle aspiration of the right lobe of the thyroid versus obtaining a 1 year follow-up thyroid ultrasound as clinically indicated.     Assessment and Plan:  Assessment  ASSESSMENT: Zoe is a 19 y.o.  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 Mom with concerns for pituitary tumor and/or thyroid tumor.  They are gender non-binary and considering treatment options for being more androgenous.    Rapid weight gain - Weight has decreased since last visit.  - Has made some good changes to sugar/food intake - Still working on exercise goals - dysphoria around breasts/breast size  Acanthosis - They has posterior neck acanthosis as well as some at their antecubital fossae and knuckles and in their truncal folds - Stable  Thyroid/ post surgical hypothyroidism - Had multinodular/cystic thyroid on ultrasound - Now s/p thyroidectomy 01/26/19 - Thyroid levels improved on 150 mcg  daily.  - clinical euthyroid - Calcium stable - Continues on Tums.  - Levels next visit   Menorrhagia/gender dysphoria - Supprelin implant placed 04/25/19 - significant dysphoria around breasts - Referral placed to Heidi. [place] [facility] in plastic surgery to discuss options for reduction/female reconstruction. - but surgery denied by insurance. Appeal also denied - Heidi [name] reports wanting female reconstructive surgery to me- but reportedly ask Heidi. [place] [facility] for "breast reduction". Mom is concerned that in the future Heidi [name] may want to breast feed and would not be able to do so if had surgery.  - Discussed that Supprelin should be removed this winter. Discussed options for hormone management.  - Discussed ovarian cysts, concerns with estrogen from aromatization of testosterone due to large adipose burden. Heidi [name] became very tearful and angry during this conversation. Feels that they will never be able to be comfortable in their body.  - Discussed options for testosterone dosing. Heidi [name] is very needle phobic and is resistant to doing home injections.   Pelvic pain  - Patient complaining of low pelvic pain - Had u/s concerning for right ovarian mass - Had MRI pelvis that did not show mass.  - Evaluated by Heidi [name] who did not want to do prophylactic oophorectomy and was confused about Heidi [name]'s plan of care - Again mom is very concerned that in the future Heidi [name] may want to be able to carry a baby and if they had an oophorectomy they would not be able to have a biologic child. Heidi [name] feels that there are other ways to make a family.   Gender dysphoria  PLAN:  1. Diagnostic:A1C today. Puberty labs and TFTs next visit.  2. Therapeutic: Lifestyle changes with focus on exercise and liquid sugars. Referral to community therapy. Start Letrozole for aromatase inhibition.  3. Patient education: Lengthy discussion of above.  4. Follow-up: Return in about 3 months (around 06/04/2020).      Lelon Huh, MD   Level of Service:  >60 minutes spent today reviewing the medical chart, counseling the patient/family, and documenting today's encounter.   Patient referred by Leveda Anna, NP for concern for cushings, rapid weight gain  Copy of this note sent to Klett, Rodman Pickle, NP

## 2020-03-06 DIAGNOSIS — F0630 Mood disorder due to known physiological condition, unspecified: Secondary | ICD-10-CM | POA: Diagnosis not present

## 2020-03-06 DIAGNOSIS — F902 Attention-deficit hyperactivity disorder, combined type: Secondary | ICD-10-CM | POA: Diagnosis not present

## 2020-03-06 DIAGNOSIS — F3481 Disruptive mood dysregulation disorder: Secondary | ICD-10-CM | POA: Diagnosis not present

## 2020-03-13 ENCOUNTER — Other Ambulatory Visit (INDEPENDENT_AMBULATORY_CARE_PROVIDER_SITE_OTHER): Payer: Self-pay | Admitting: Pediatric Endocrinology

## 2020-03-20 ENCOUNTER — Encounter: Payer: Self-pay | Admitting: *Deleted

## 2020-03-29 DIAGNOSIS — R102 Pelvic and perineal pain: Secondary | ICD-10-CM | POA: Diagnosis not present

## 2020-03-30 ENCOUNTER — Encounter: Payer: Self-pay | Admitting: Radiology

## 2020-04-18 ENCOUNTER — Encounter: Payer: Self-pay | Admitting: Family Medicine

## 2020-04-18 ENCOUNTER — Other Ambulatory Visit: Payer: Self-pay

## 2020-04-18 ENCOUNTER — Ambulatory Visit (INDEPENDENT_AMBULATORY_CARE_PROVIDER_SITE_OTHER): Payer: Medicaid Other | Admitting: Family Medicine

## 2020-04-18 DIAGNOSIS — R102 Pelvic and perineal pain: Secondary | ICD-10-CM | POA: Diagnosis not present

## 2020-04-18 MED ORDER — CYCLOBENZAPRINE HCL 10 MG PO TABS: 10.0000 mg | ORAL_TABLET | Freq: Three times a day (TID) | ORAL | 1 refills | Status: DC | PRN

## 2020-04-18 NOTE — Progress Notes (Signed)
Subjective:    Patient ID: Heidi [name] is a 19 y.o. adult presenting with No chief complaint on file.  on 04/18/2020  HPI: Still having a lot of pain. They are having constant pain and then at times pain is more severe than other and is shooting Pain seems to be more on the left but is all over. No cycles with the Supprelin. Previously had hemorrhagic cysts x 2 on left.   Review of Systems  Constitutional: Negative for chills and fever.  Respiratory: Negative for shortness of breath.   Cardiovascular: Negative for chest pain.  Gastrointestinal: Positive for abdominal pain. Negative for nausea and vomiting.  Genitourinary: Negative for dysuria and hematuria.       No vaginal bleeding  Skin: Negative for rash.  Psychiatric/Behavioral: Positive for suicidal ideas.      Objective:    BP 98/77   Pulse 80   Wt 293 lb (132.9 kg)   BMI 50.29 kg/m  Physical Exam Constitutional:      General: Heidi Bumpus "Heidi [name]" is not in acute distress.    Appearance: Heidi Ventresca "Heidi [name]" is well-developed.  HENT:     Head: Normocephalic and atraumatic.  Eyes:     General: No scleral icterus. Cardiovascular:     Rate and Rhythm: Normal rate.  Pulmonary:     Effort: Pulmonary effort is normal.  Abdominal:     Palpations: Abdomen is soft.  Musculoskeletal:     Cervical back: Neck supple.  Skin:    General: Skin is warm and dry.  Neurological:     Mental Status: Heidi Bellmore "Heidi [name]" is alert and oriented to person, place, and time.  Psychiatric:        Mood and Affect: Mood normal.        Thought Content: Thought content normal. Thought content does not include suicidal ideation. Thought content does not include suicidal plan.     Comments: Thinks it might be easier if they were not around         Assessment & Plan:   Problem List Items Addressed This Visit      Unprioritized   Pelvic pain    Discussed with Heidi [name] without the presence of their mother goals of care.  Discussed option of  testosterone and how ovarian removal might be helpful in this way.  They are not interested in removing ovaries at this time or bottom surgery at all.  We discussed what might be causing pain and how there are GI, GU, musculoskeletal, other causes for pain in the pelvis.  We discussed how unlikely it would be to find endometriosis giving lack of cycles or cyclical nature of pain.  We discussed multimodal treatment options including heat, physical therapy, Flexeril, ibuprofen.  We could add gabapentin as needed.  When the mother returned to the conversation, she was adamant about finding a cause for the pain with diagnostic laparoscopy.  We discussed risks, benefits, alternatives, complications, rate of complications, risk of injury to bowel bladder or ureters, or vascular structures with surgery.  We also discussed removal of the left ovary for recurrent hemorrhagic cyst however this would likely lead to hemorrhagic cyst on the right.  Given Heidi [name]'s desire to not have their ovaries removed I would not recommend this at time of surgery.  We will consider paratubal cyst removal although I doubt this is causing the pain that Heidi [name] is having.  We will book for diagnostic laparoscopy.         Total time  in review of prior notes, pathology, labs, history taking, review with patient, exam, note writing, discussion of options, plan for next steps, alternatives and risks of treatment: 52 minutes.  Return in about 3 months (around 07/19/2020) for postop check.  Donnamae Jude 04/18/2020 5:40 PM

## 2020-04-18 NOTE — Assessment & Plan Note (Signed)
Discussed with [name] without the presence of their mother goals of care.  Discussed option of testosterone and how ovarian removal might be helpful in this way.  They are not interested in removing ovaries at this time or bottom surgery at all.  We discussed what might be causing pain and how there are GI, GU, musculoskeletal, other causes for pain in the pelvis.  We discussed how unlikely it would be to find endometriosis giving lack of cycles or cyclical nature of pain.  We discussed multimodal treatment options including heat, physical therapy, Flexeril, ibuprofen.  We could add gabapentin as needed.  When the mother returned to the conversation, she was adamant about finding a cause for the pain with diagnostic laparoscopy.  We discussed risks, benefits, alternatives, complications, rate of complications, risk of injury to bowel bladder or ureters, or vascular structures with surgery.  We also discussed removal of the left ovary for recurrent hemorrhagic cyst however this would likely lead to hemorrhagic cyst on the right.  Given [name]'s desire to not have their ovaries removed I would not recommend this at time of surgery.  We will consider paratubal cyst removal although I doubt this is causing the pain that [name] is having.  We will book for diagnostic laparoscopy.

## 2020-04-23 NOTE — H&P (Signed)
Heidi [name] is an 19 y.o. No obstetric history on file. adult.   Chief Complaint: Pelvic pain HPI: On-going pelvic pain of unknown origin with desire for diagnostic completion.  Past Medical History:  Diagnosis Date  . ADHD (attention deficit hyperactivity disorder)   . Allergy    cats,  . Cough variant asthma   . Episodic mood disorder ([place])   . Family history of adverse reaction to anesthesia    mother was once combative and had PONV  . Family history of kidney cancer   . Family history of lung cancer   . Family history of uterine cancer   . Gastric ulcer   . Hemorrhagic ovarian cyst    Left hemorrhagic ovarian cyst per mother  . History of Chiari malformation    decompression 09/30/19  . Multinodular thyroid   . Obesity   . Recurrent upper respiratory infection (URI)     Past Surgical History:  Procedure Laterality Date  . ABCESS DRAINAGE Right   . COLONOSCOPY N/A 11/04/2016   Procedure: COLONOSCOPY;  Surgeon: Joycelyn Rua, MD;  Location: [facility];  Service: Gastroenterology;  Laterality: N/A;  . ESOPHAGOGASTRODUODENOSCOPY N/A 11/04/2016   Procedure: ESOPHAGOGASTRODUODENOSCOPY (EGD);  Surgeon: Joycelyn Rua, MD;  Location: [facility];  Service: Gastroenterology;  Laterality: N/A;  . SUPPRELIN IMPLANT Left 04/25/2019   Procedure: SUPPRELIN IMPLANT PEDIATRIC;  Surgeon: Stanford Scotland, MD;  Location: [facility];  Service: Pediatrics;  Laterality: Left;  . THYROIDECTOMY N/A 01/26/2019   Procedure: TOTAL THYROIDECTOMY;  Surgeon: Fredirick Maudlin, MD;  Location: [facility];  Service: General;  Laterality: N/A;    Family History  Problem Relation Age of Onset  . Diabetes Father   . Depression Father   . Heart disease Father        3 MI, triple bipass  . Hyperlipidemia Father   . Hypertension Father   . Learning disabilities Father        ADD/ADHD  . Mental illness Father        bipolar  . Vision loss Father        lens replacement surgery  . Allergic  rhinitis Father   . Anxiety disorder Father   . Bipolar disorder Father   . ADD / ADHD Father   . Asthma Mother   . Arthritis Mother   . Depression Mother   . Allergic rhinitis Mother   . Urticaria Mother   . Thyroid cancer Mother 79       papillary  . Asthma Brother   . Learning disabilities Brother        disorder of written expression  . Allergic rhinitis Brother   . ADD / ADHD Brother   . Kidney disease Maternal Grandmother   . Miscarriages / Stillbirths Maternal Grandmother   . Kidney cancer Maternal Grandmother 45       "transitional cell"  . Alcohol abuse Maternal Grandfather   . Mental illness Maternal Grandfather        Alzheimers, Vascular Damention  . Arthritis Paternal Grandmother   . COPD Paternal Grandmother   . COPD Paternal Grandfather   . Immunodeficiency Paternal Grandfather   . Drug abuse Maternal Uncle   . Early death Maternal Uncle   . Migraines Cousin   . Kidney cancer Other        diagnosed in his 18s  . Lung cancer Maternal Uncle 56       rare subtype of small cell lung cancer  . Thyroid cancer Maternal Great-grandmother  32  . Uterine cancer Maternal Great-grandmother 60  . Birth defects Neg Hx   . Hearing loss Neg Hx   . Mental retardation Neg Hx   . Stroke Neg Hx   . Varicose Veins Neg Hx   . Angioedema Neg Hx   . Eczema Neg Hx   . Seizures Neg Hx   . Schizophrenia Neg Hx   . Autism Neg Hx    Social History:  reports that Heidi Schein "[name]" has never smoked. Heidi Bushy "[name]" has never used smokeless tobacco. Heidi Bushy "[name]" reports that Heidi Schein "[name]" does not drink alcohol and does not use drugs.  Allergies:  Allergies  Allergen Reactions  . Other Other (See Comments)    Cats, seasonal, mom/gm   . Sulfa Antibiotics Other (See Comments)    Other, family history of allergy    No medications prior to admission.    A comprehensive review of systems was negative.  There were no vitals taken for this visit. General appearance:  alert, cooperative, appears stated age and morbidly obese Head: Normocephalic, without obvious abnormality, atraumatic Neck: supple, symmetrical, trachea midline Lungs: normal effort Heart: regular rate and rhythm Abdomen: soft, non-tender; bowel sounds normal; no masses,  no organomegaly Extremities: Homans sign is negative, no sign of DVT Skin: Skin color, texture, turgor normal. No rashes or lesions Neurologic: Grossly normal   Lab Results  Component Value Date   WBC 16.2 (H) 10/11/2019   HGB 14.7 10/11/2019   HCT 47.3 (H) 10/11/2019   MCV 92.7 10/11/2019   PLT 442 (H) 10/11/2019   Lab Results  Component Value Date   PREGTESTUR NEGATIVE 04/25/2019   HCG <5.0 10/11/2019     Assessment/Plan Principal Problem:   Pelvic pain Active Problems:   Ovarian cyst  For diagnostic laparoscopy and indicated procedures. Risks include but are not limited to bleeding, infection, injury to surrounding structures, including bowel, bladder and ureters, blood clots, and death.  Likelihood of success is high.    Donnamae Jude 04/23/2020, 12:14 PM

## 2020-04-30 ENCOUNTER — Other Ambulatory Visit (HOSPITAL_COMMUNITY)
Admission: RE | Admit: 2020-04-30 | Discharge: 2020-04-30 | Disposition: A | Discharge status: Home / Self Care | Payer: Medicaid Other | Source: Ambulatory Visit | Attending: Family Medicine | Admitting: Family Medicine

## 2020-04-30 DIAGNOSIS — Z20822 Contact with and (suspected) exposure to covid-19: Secondary | ICD-10-CM | POA: Diagnosis not present

## 2020-04-30 DIAGNOSIS — Z01812 Encounter for preprocedural laboratory examination: Secondary | ICD-10-CM | POA: Diagnosis not present

## 2020-04-30 LAB — SARS CORONAVIRUS 2 (TAT 6-24 HRS): SARS Coronavirus 2: NEGATIVE

## 2020-05-01 ENCOUNTER — Other Ambulatory Visit: Payer: Self-pay

## 2020-05-01 ENCOUNTER — Encounter (HOSPITAL_COMMUNITY): Payer: Self-pay | Admitting: Family Medicine

## 2020-05-01 NOTE — Progress Notes (Addendum)
Debbe Odea request that I speak to their mother Crystallee Werden. Laquita Harlan reports that Martie Round has not had shortness of breath or chest pain. Patient tested negative for Covid_11/15/21_ and has been in quarantine since that time.  [name] has anxiety isuses - mother states that she needs to be with patient in pre surgery. I told Mickel Baas that it could be arranged.   I sent Dr.Pratt a message to Dr. Kennon Rounds to ask if she needs an antibiotic pre op.  I received a message from Dr. Kennon Rounds that said patient does not need an antibiotic for a laparoscopy.

## 2020-05-02 ENCOUNTER — Other Ambulatory Visit: Payer: Self-pay

## 2020-05-02 ENCOUNTER — Ambulatory Visit (HOSPITAL_COMMUNITY)
Admission: RE | Admit: 2020-05-02 | Discharge: 2020-05-02 | Disposition: A | Discharge status: Home / Self Care | Payer: Medicaid Other | Attending: Family Medicine | Admitting: Family Medicine

## 2020-05-02 ENCOUNTER — Ambulatory Visit (HOSPITAL_COMMUNITY): Payer: Medicaid Other | Admitting: Physician Assistant

## 2020-05-02 ENCOUNTER — Encounter (HOSPITAL_COMMUNITY)
Admission: RE | Disposition: A | Discharge status: Home / Self Care | Payer: Self-pay | Source: Home / Self Care | Attending: Family Medicine

## 2020-05-02 ENCOUNTER — Encounter (HOSPITAL_COMMUNITY): Payer: Self-pay | Admitting: Family Medicine

## 2020-05-02 DIAGNOSIS — N838 Other noninflammatory disorders of ovary, fallopian tube and broad ligament: Secondary | ICD-10-CM | POA: Diagnosis not present

## 2020-05-02 DIAGNOSIS — E890 Postprocedural hypothyroidism: Secondary | ICD-10-CM | POA: Diagnosis not present

## 2020-05-02 DIAGNOSIS — R102 Pelvic and perineal pain: Secondary | ICD-10-CM | POA: Insufficient documentation

## 2020-05-02 DIAGNOSIS — N83209 Unspecified ovarian cyst, unspecified side: Secondary | ICD-10-CM | POA: Diagnosis present

## 2020-05-02 DIAGNOSIS — E559 Vitamin D deficiency, unspecified: Secondary | ICD-10-CM | POA: Diagnosis not present

## 2020-05-02 DIAGNOSIS — F909 Attention-deficit hyperactivity disorder, unspecified type: Secondary | ICD-10-CM | POA: Diagnosis not present

## 2020-05-02 HISTORY — DX: Other complications of anesthesia, initial encounter: T88.59XA

## 2020-05-02 HISTORY — DX: Gastro-esophageal reflux disease without esophagitis: K21.9

## 2020-05-02 HISTORY — DX: Depression, unspecified: F32.A

## 2020-05-02 HISTORY — PX: LAPAROSCOPY: SHX197

## 2020-05-02 HISTORY — DX: Anxiety disorder, unspecified: F41.9

## 2020-05-02 HISTORY — DX: Nausea with vomiting, unspecified: R11.2

## 2020-05-02 HISTORY — DX: Post-traumatic stress disorder, unspecified: F43.10

## 2020-05-02 HISTORY — DX: Other specified postprocedural states: Z98.890

## 2020-05-02 HISTORY — DX: Unspecified asthma, uncomplicated: J45.909

## 2020-05-02 LAB — POCT PREGNANCY, URINE: Preg Test, Ur: NEGATIVE

## 2020-05-02 LAB — CBC
HCT: 45.1 % (ref 36.0–46.0)
Hemoglobin: 14.8 g/dL (ref 12.0–15.0)
MCH: 29 pg (ref 26.0–34.0)
MCHC: 32.8 g/dL (ref 30.0–36.0)
MCV: 88.4 fL (ref 80.0–100.0)
Platelets: 418 10*3/uL — ABNORMAL HIGH (ref 150–400)
RBC: 5.1 MIL/uL (ref 3.87–5.11)
RDW: 13.3 % (ref 11.5–15.5)
WBC: 10 10*3/uL (ref 4.0–10.5)
nRBC: 0 % (ref 0.0–0.2)

## 2020-05-02 SURGERY — LAPAROSCOPY, DIAGNOSTIC
Anesthesia: General

## 2020-05-02 MED ORDER — PROPOFOL 10 MG/ML IV BOLUS
INTRAVENOUS | Status: DC | PRN
  Administered 2020-05-02: 200 mg via INTRAVENOUS

## 2020-05-02 MED ORDER — LACTATED RINGERS IV SOLN: INTRAVENOUS | Status: DC

## 2020-05-02 MED ORDER — FENTANYL CITRATE (PF) 250 MCG/5ML IJ SOLN
INTRAMUSCULAR | Status: DC | PRN
  Administered 2020-05-02 (×2): 50 ug via INTRAVENOUS
  Administered 2020-05-02: 25 ug via INTRAVENOUS

## 2020-05-02 MED ORDER — DEXAMETHASONE SODIUM PHOSPHATE 10 MG/ML IJ SOLN
INTRAMUSCULAR | Status: DC | PRN
  Administered 2020-05-02: 10 mg via INTRAVENOUS

## 2020-05-02 MED ORDER — ORAL CARE MOUTH RINSE: 15.0000 mL | Freq: Once | OROMUCOSAL | Status: AC

## 2020-05-02 MED ORDER — ACETAMINOPHEN 500 MG PO TABS
ORAL_TABLET | ORAL | Status: AC
  Administered 2020-05-02: 1000 mg via ORAL
  Filled 2020-05-02: qty 2

## 2020-05-02 MED ORDER — ACETAMINOPHEN 500 MG PO TABS: 1000.0000 mg | ORAL_TABLET | ORAL | Status: AC

## 2020-05-02 MED ORDER — ROCURONIUM BROMIDE 10 MG/ML (PF) SYRINGE
PREFILLED_SYRINGE | INTRAVENOUS | Status: DC | PRN
  Administered 2020-05-02: 50 mg via INTRAVENOUS

## 2020-05-02 MED ORDER — BUPIVACAINE HCL (PF) 0.25 % IJ SOLN
INTRAMUSCULAR | Status: AC
  Filled 2020-05-02: qty 30

## 2020-05-02 MED ORDER — MIDAZOLAM HCL 2 MG/2ML IJ SOLN
INTRAMUSCULAR | Status: AC
  Filled 2020-05-02: qty 2

## 2020-05-02 MED ORDER — CHLORHEXIDINE GLUCONATE 0.12 % MT SOLN: 15.0000 mL | Freq: Once | OROMUCOSAL | Status: AC

## 2020-05-02 MED ORDER — BUPIVACAINE HCL (PF) 0.25 % IJ SOLN
INTRAMUSCULAR | Status: DC | PRN
  Administered 2020-05-02: 25 mL

## 2020-05-02 MED ORDER — SUGAMMADEX SODIUM 200 MG/2ML IV SOLN
INTRAVENOUS | Status: DC | PRN
  Administered 2020-05-02: 400 mg via INTRAVENOUS

## 2020-05-02 MED ORDER — FENTANYL CITRATE (PF) 250 MCG/5ML IJ SOLN
INTRAMUSCULAR | Status: AC
  Filled 2020-05-02: qty 5

## 2020-05-02 MED ORDER — CHLORHEXIDINE GLUCONATE 0.12 % MT SOLN
OROMUCOSAL | Status: AC
  Administered 2020-05-02: 15 mL via OROMUCOSAL
  Filled 2020-05-02: qty 15

## 2020-05-02 MED ORDER — MIDAZOLAM HCL 2 MG/2ML IJ SOLN
INTRAMUSCULAR | Status: DC | PRN
  Administered 2020-05-02: 2 mg via INTRAVENOUS

## 2020-05-02 MED ORDER — KETOROLAC TROMETHAMINE 30 MG/ML IJ SOLN
INTRAMUSCULAR | Status: DC | PRN
  Administered 2020-05-02: 30 mg via INTRAVENOUS

## 2020-05-02 MED ORDER — POVIDONE-IODINE 10 % EX SWAB
2.0000 "application " | Freq: Once | CUTANEOUS | Status: AC
  Administered 2020-05-02: 2 via TOPICAL

## 2020-05-02 MED ORDER — LIDOCAINE 2% (20 MG/ML) 5 ML SYRINGE
INTRAMUSCULAR | Status: DC | PRN
  Administered 2020-05-02: 60 mg via INTRAVENOUS

## 2020-05-02 MED ORDER — OXYCODONE HCL 5 MG PO TABS: 5.0000 mg | ORAL_TABLET | Freq: Four times a day (QID) | ORAL | 0 refills | Status: DC | PRN

## 2020-05-02 MED ORDER — ONDANSETRON HCL 4 MG/2ML IJ SOLN
INTRAMUSCULAR | Status: DC | PRN
  Administered 2020-05-02: 4 mg via INTRAVENOUS

## 2020-05-02 SURGICAL SUPPLY — 32 items
ADH SKN CLS APL DERMABOND .7 (GAUZE/BANDAGES/DRESSINGS) ×1
BAG SPEC RTRVL LRG 6X4 10 (ENDOMECHANICALS)
BLADE SURG 15 STRL LF DISP TIS (BLADE) ×1 IMPLANT
BLADE SURG 15 STRL SS (BLADE) ×2
CNTNR URN SCR LID CUP LEK RST (MISCELLANEOUS) IMPLANT
CONT SPEC 4OZ STRL OR WHT (MISCELLANEOUS) ×2
DERMABOND ADVANCED (GAUZE/BANDAGES/DRESSINGS) ×1
DERMABOND ADVANCED .7 DNX12 (GAUZE/BANDAGES/DRESSINGS) IMPLANT
DRSG OPSITE POSTOP 3X4 (GAUZE/BANDAGES/DRESSINGS) ×1 IMPLANT
DURAPREP 26ML APPLICATOR (WOUND CARE) ×2 IMPLANT
GLOVE BIOGEL PI IND STRL 7.0 (GLOVE) ×4 IMPLANT
GLOVE BIOGEL PI INDICATOR 7.0 (GLOVE) ×4
GLOVE ECLIPSE 7.0 STRL STRAW (GLOVE) ×2 IMPLANT
GOWN STRL REUS W/ TWL LRG LVL3 (GOWN DISPOSABLE) ×3 IMPLANT
GOWN STRL REUS W/TWL LRG LVL3 (GOWN DISPOSABLE) ×6
KIT TURNOVER KIT B (KITS) ×2 IMPLANT
PACK LAPAROSCOPY BASIN (CUSTOM PROCEDURE TRAY) ×2 IMPLANT
PACK TRENDGUARD 450 HYBRID PRO (MISCELLANEOUS) IMPLANT
POUCH SPECIMEN RETRIEVAL 10MM (ENDOMECHANICALS) IMPLANT
PROTECTOR NERVE ULNAR (MISCELLANEOUS) ×4 IMPLANT
SET IRRIG TUBING LAPAROSCOPIC (IRRIGATION / IRRIGATOR) IMPLANT
SET TUBE SMOKE EVAC HIGH FLOW (TUBING) ×2 IMPLANT
SHEARS HARMONIC ACE PLUS 36CM (ENDOMECHANICALS) IMPLANT
SLEEVE ENDOPATH XCEL 5M (ENDOMECHANICALS) ×2 IMPLANT
SUT VIC AB 3-0 X1 27 (SUTURE) ×2 IMPLANT
SUT VICRYL 0 UR6 27IN ABS (SUTURE) ×4 IMPLANT
TOWEL GREEN STERILE FF (TOWEL DISPOSABLE) ×4 IMPLANT
TRAY FOLEY W/BAG SLVR 14FR (SET/KITS/TRAYS/PACK) ×2 IMPLANT
TRENDGUARD 450 HYBRID PRO PACK (MISCELLANEOUS) ×2
TROCAR BALLN 12MMX100 BLUNT (TROCAR) ×2 IMPLANT
TROCAR XCEL NON-BLD 5MMX100MML (ENDOMECHANICALS) ×2 IMPLANT
WARMER LAPAROSCOPE (MISCELLANEOUS) ×2 IMPLANT

## 2020-05-02 NOTE — Anesthesia Preprocedure Evaluation (Addendum)
Anesthesia Evaluation  Patient identified by MRN, date of birth, ID band Patient awake    Reviewed: Allergy & Precautions, NPO status , Patient's Chart, lab work & pertinent test results  History of Anesthesia Complications Negative for: history of anesthetic complications  Airway Mallampati: II  TM Distance: >3 FB Neck ROM: Full    Dental  (+) Dental Advisory Given, Teeth Intact   Pulmonary asthma ,    Pulmonary exam normal        Cardiovascular negative cardio ROS Normal cardiovascular exam     Neuro/Psych  Headaches, PSYCHIATRIC DISORDERS Anxiety Depression  Episodic mood d/o  Chiari malformation s/p C1 decompression, laminectomy     GI/Hepatic Neg liver ROS, PUD, GERD  Medicated and Controlled,  Endo/Other  Hypothyroidism Morbid obesity  Renal/GU negative Renal ROS     Musculoskeletal negative musculoskeletal ROS (+)   Abdominal   Peds  (+) ADHD Hematology negative hematology ROS (+)   Anesthesia Other Findings Covid test negative   Reproductive/Obstetrics                            Anesthesia Physical Anesthesia Plan  ASA: III  Anesthesia Plan: General   Post-op Pain Management:    Induction: Intravenous  PONV Risk Score and Plan: 3 and Treatment may vary due to age or medical condition, Ondansetron, Midazolam and Dexamethasone  Airway Management Planned: Oral ETT  Additional Equipment: None  Intra-op Plan:   Post-operative Plan: Extubation in OR  Informed Consent: I have reviewed the patients History and Physical, chart, labs and discussed the procedure including the risks, benefits and alternatives for the proposed anesthesia with the patient or authorized representative who has indicated his/her understanding and acceptance.     Dental advisory given  Plan Discussed with: CRNA and Anesthesiologist  Anesthesia Plan Comments:        Anesthesia Quick  Evaluation

## 2020-05-02 NOTE — Anesthesia Postprocedure Evaluation (Signed)
Anesthesia Post Note  Patient: [name]  Procedure(s) Performed: LAPAROSCOPY DIAGNOSTIC WITH BIOPSIES (N/A )     Patient location during evaluation: PACU Anesthesia Type: General Level of consciousness: awake and alert Pain management: pain level controlled Vital Signs Assessment: post-procedure vital signs reviewed and stable Respiratory status: spontaneous breathing, nonlabored ventilation and respiratory function stable Cardiovascular status: blood pressure returned to baseline and stable Postop Assessment: no apparent nausea or vomiting Anesthetic complications: no   No complications documented.  Last Vitals:  Vitals:   05/02/20 1645 05/02/20 1700  BP: (!) 134/92 130/79  Pulse: 63 65  Resp: 16 14  Temp:    SpO2: 98%     Last Pain:  Vitals:   05/02/20 1700  TempSrc:   PainSc: 0-No pain                 Audry Pili

## 2020-05-02 NOTE — Transfer of Care (Signed)
Immediate Anesthesia Transfer of Care Note  Patient: Heidi [name]  Procedure(s) Performed: LAPAROSCOPY DIAGNOSTIC WITH BIOPSIES (N/A )  Patient Location: PACU  Anesthesia Type:General  Level of Consciousness: awake, alert  and oriented  Airway & Oxygen Therapy: Patient Spontanous Breathing and Patient connected to face mask oxygen  Post-op Assessment: Report given to RN and Post -op Vital signs reviewed and stable  Post vital signs: Reviewed and stable  Last Vitals:  Vitals Value Taken Time  BP 131/79 05/02/20 1558  Temp    Pulse 81 05/02/20 1601  Resp 15 05/02/20 1601  SpO2 100 % 05/02/20 1601  Vitals shown include unvalidated device data.  Last Pain:  Vitals:   05/02/20 1127  TempSrc:   PainSc: 9       Patients Stated Pain Goal: 2 (53/79/43 2761)  Complications: No complications documented.

## 2020-05-02 NOTE — Discharge Instructions (Signed)
Diagnostic Laparoscopy, Care After This sheet gives you information about how to care for yourself after your procedure. Your health care provider may also give you more specific instructions. If you have problems or questions, contact your health care provider. What can I expect after the procedure? After the procedure, it is common to have:  Mild discomfort in the abdomen.  Sore throat. Women who have laparoscopy with pelvic examination may have mild cramping and fluid coming from the vagina for a few days after the procedure. Follow these instructions at home: Medicines  Take over-the-counter and prescription medicines only as told by your health care provider.  If you were prescribed an antibiotic medicine, take it as told by your health care provider. Do not stop taking the antibiotic even if you start to feel better. Driving  Do not drive for 24 hours if you were given a medicine to help you relax (sedative) during your procedure.  Do not drive or use heavy machinery while taking prescription pain medicine. Bathing  Do not take baths, swim, or use a hot tub until your health care provider approves. You may take showers. Incision care   Follow instructions from your health care provider about how to take care of your incisions. Make sure you: ? Wash your hands with soap and water before you change your bandage (dressing). If soap and water are not available, use hand sanitizer. ? Change your dressing as told by your health care provider. ? Leave stitches (sutures), skin glue, or adhesive strips in place. These skin closures may need to stay in place for 2 weeks or longer. If adhesive strip edges start to loosen and curl up, you may trim the loose edges. Do not remove adhesive strips completely unless your health care provider tells you to do that.  Check your incision areas every day for signs of infection. Check for: ? Redness, swelling, or pain. ? Fluid or  blood. ? Warmth. ? Pus or a bad smell. Activity  Return to your normal activities as told by your health care provider. Ask your health care provider what activities are safe for you.  Do not lift anything that is heavier than 10 lb (4.5 kg), or the limit that you are told, until your health care provider says that it is safe. General instructions  To prevent or treat constipation while you are taking prescription pain medicine, your health care provider may recommend that you: ? Drink enough fluid to keep your urine pale yellow. ? Take over-the-counter or prescription medicines. ? Eat foods that are high in fiber, such as fresh fruits and vegetables, whole grains, and beans. ? Limit foods that are high in fat and processed sugars, such as fried and sweet foods.  Do not use any products that contain nicotine or tobacco, such as cigarettes and e-cigarettes. If you need help quitting, ask your health care provider.  Keep all follow-up visits as told by your health care provider. This is important. Contact a health care provider if:  You develop shoulder pain.  You feel lightheaded or faint.  You are unable to pass gas or have a bowel movement.  You feel nauseous or you vomit.  You develop a rash.  You have redness, swelling, or pain around any incision.  You have fluid or blood coming from any incision.  Any incision feels warm to the touch.  You have pus or a bad smell coming from any incision.  You have a fever or chills. Get help  right away if:  You have severe pain.  You have vomiting that does not go away.  You have heavy bleeding from the vagina.  Any incision opens.  You have trouble breathing.  You have chest pain. Summary  After the procedure, it is common to have mild discomfort in the abdomen and a sore throat.  Check your incision areas every day for signs of infection.  Return to your normal activities as told by your health care provider. Ask  your health care provider what activities are safe for you. This information is not intended to replace advice given to you by your health care provider. Make sure you discuss any questions you have with your health care provider. Document Revised: 05/15/2017 Document Reviewed: 11/26/2016 Elsevier Patient Education  [place].

## 2020-05-02 NOTE — Op Note (Addendum)
Preoperative diagnosis: Pelvic pain   Postoperative diagnosis: Same  Procedure: Diagnostic laparoscopy  Surgeon: Darron Doom, MD  Anesthesia: Judye Bos, MD  Findings: Normal appearing uterus, tubes, ovaries.  No evidence of endometriosis.  Normal anterior and posterior cul-de-sacs.  Normal liver edge, no significant adhesive disease. Small right paratubal cyst noted.  Estimated blood loss: Minimal  Complications: None known  Specimens: Peritoneal biopsies  Disposition of Specimen:  Pathology  Reason for procedure: 19 y.o.  with h/o pelvic pain who desired diagnostic confirmation of normal pelvic anatomy and no evidence of endometriosis  Procedure: Patient was taken to the operating room was placed in dorsal lithotomy in [place]. She was prepped and draped in the usual sterile fashion. A timeout was performed. The patient had SCDs in place. Foley catheter is used to drain bladder. Speculum was placed inside the vagina. The cervix was visualized and grasped anteriorly with a single-tooth tenaculum. A Hulka tenaculum was placed through the cervix for uterine manipulation. The single tooth tenaculum and speculum were removed from the vagina.  Attention was then turned to the abdomen. Four cc of 0.25% Marcaine was injected at the umbilicus. Two Allis clamps were used to tent up the skin of the umbilicus a vertical one half centimeter incision was made here. The fascia was incised with the knife  And the peritoneum was entered sharply with this incision. Two edges of the fascia were tagged with a 0 Vicryl suture on a UR 6 needle. A Hassan trocar was placed through this incision and a pneumoperitoneum was created. The patient was then placed in Trendelenburg. The pelvis was inspected in a systematic fashion. The patient's right and left tubes and ovaries appeared normal. The posterior and anterior cul-de-sacs were inspected and felt to be normal. The ovarian fossa were inspected and  also felt to be normal. The appendix was not visualized. The upper abdomen was inspected the liver edge gallbladder and stomach appeared normal. Several peritoneal biopsies were obtained and a paratubal cyst was biopsied and removed in this fashion. There were no additional findings in the pelvis and so the procedure was terminated. All instrument, needle  and lap counts were correct x 2. The patient was awakened to recovery in stable condition.  Donnamae Jude MD 05/02/2020 3:51 PM

## 2020-05-02 NOTE — Anesthesia Procedure Notes (Signed)
Procedure Name: Intubation Date/Time: 05/02/2020 2:40 PM Performed by: Dorthea Cove, CRNA Pre-anesthesia Checklist: Patient identified, Emergency Drugs available, Suction available and Patient being monitored Patient Re-evaluated:Patient Re-evaluated prior to induction Oxygen Delivery Method: Circle system utilized Preoxygenation: Pre-oxygenation with 100% oxygen Induction Type: IV induction Ventilation: Mask ventilation without difficulty Laryngoscope Size: Mac and 3 Grade View: Grade I Tube type: Oral Number of attempts: 1 Airway Equipment and Method: Stylet and Oral airway (ramp positioning) Placement Confirmation: ETT inserted through vocal cords under direct vision,  positive ETCO2 and breath sounds checked- equal and bilateral Secured at: 21 cm Tube secured with: Tape Dental Injury: Teeth and Oropharynx as per pre-operative assessment

## 2020-05-02 NOTE — Interval H&P Note (Signed)
History and Physical Interval Note:  05/02/2020 12:18 PM  Heidi [name]  has presented today for surgery, with the diagnosis of PELVIC PAIN.  The various methods of treatment have been discussed with the patient and family. After consideration of risks, benefits and other options for treatment, the patient has consented to  Procedure(s): LAPAROSCOPY DIAGNOSTIC (N/A) as a surgical intervention.  The patient's history has been reviewed, patient examined, no change in status, stable for surgery.  I have reviewed the patient's chart and labs.  Questions were answered to the patient's satisfaction.     Donnamae Jude

## 2020-05-03 ENCOUNTER — Encounter (HOSPITAL_COMMUNITY): Payer: Self-pay | Admitting: Family Medicine

## 2020-05-04 LAB — SURGICAL PATHOLOGY

## 2020-05-07 DIAGNOSIS — Z8669 Personal history of other diseases of the nervous system and sense organs: Secondary | ICD-10-CM | POA: Diagnosis not present

## 2020-05-07 DIAGNOSIS — G950 Syringomyelia and syringobulbia: Secondary | ICD-10-CM | POA: Diagnosis not present

## 2020-05-07 DIAGNOSIS — Z5189 Encounter for other specified aftercare: Secondary | ICD-10-CM | POA: Diagnosis not present

## 2020-05-07 DIAGNOSIS — G935 Compression of brain: Secondary | ICD-10-CM | POA: Diagnosis not present

## 2020-05-17 ENCOUNTER — Encounter: Payer: Self-pay | Admitting: Physical Therapy

## 2020-05-17 ENCOUNTER — Ambulatory Visit: Payer: Medicaid Other | Attending: Urology | Admitting: Physical Therapy

## 2020-05-17 ENCOUNTER — Other Ambulatory Visit: Payer: Self-pay

## 2020-05-17 DIAGNOSIS — R252 Cramp and spasm: Secondary | ICD-10-CM | POA: Diagnosis not present

## 2020-05-17 DIAGNOSIS — R278 Other lack of coordination: Secondary | ICD-10-CM | POA: Insufficient documentation

## 2020-05-17 DIAGNOSIS — M6281 Muscle weakness (generalized): Secondary | ICD-10-CM | POA: Diagnosis not present

## 2020-05-17 DIAGNOSIS — M545 Low back pain, unspecified: Secondary | ICD-10-CM | POA: Diagnosis not present

## 2020-05-17 NOTE — Patient Instructions (Addendum)
   Guided Meditation for Pelvic Floor Relaxation  [employer]  [email or web address]  [facility] [address] [place], [place] [address] Phone # [phone] Fax [phone]
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
## 2020-05-22 ENCOUNTER — Encounter: Payer: Self-pay | Admitting: General Practice

## 2020-05-22 ENCOUNTER — Ambulatory Visit (INDEPENDENT_AMBULATORY_CARE_PROVIDER_SITE_OTHER): Payer: Medicaid Other | Admitting: General Practice

## 2020-05-22 ENCOUNTER — Telehealth: Payer: Self-pay | Admitting: General Practice

## 2020-05-22 ENCOUNTER — Other Ambulatory Visit: Payer: Self-pay

## 2020-05-22 VITALS — BP 119/68 | HR 98 | Temp 98.8°F | Ht 64.0 in | Wt 291.0 lb

## 2020-05-22 DIAGNOSIS — T8141XA Infection following a procedure, superficial incisional surgical site, initial encounter: Secondary | ICD-10-CM

## 2020-05-22 DIAGNOSIS — Z5189 Encounter for other specified aftercare: Secondary | ICD-10-CM | POA: Diagnosis not present

## 2020-05-22 MED ORDER — CEFADROXIL 500 MG PO CAPS: 500.0000 mg | ORAL_CAPSULE | Freq: Two times a day (BID) | ORAL | 0 refills | Status: AC

## 2020-05-22 MED ORDER — IBUPROFEN 800 MG PO TABS: 800.0000 mg | ORAL_TABLET | Freq: Three times a day (TID) | ORAL | 0 refills | Status: DC | PRN

## 2020-05-22 NOTE — Progress Notes (Signed)
Patient was assessed and managed by nursing staff during this encounter, I stepped in to verify RN's wound assessment and added antibiotic prescription. I have reviewed the chart and agree with the documentation and plan. I have also made any necessary editorial changes.  Gabriel Carina, CNM 05/22/2020 1:32 PM

## 2020-05-22 NOTE — Telephone Encounter (Signed)
Patient was transferred to office from after hours nurse after reporting severe pain and possible wound infection. They report skin around navel being red and puffy for at least a week but potentially getting worse. They also report yellow, green, brown drainage present at times after cleaning incision. Patient states pain is a 3-4 if not moving but the second they move pain escalates to a 8-9 +. Discussed they would still need to come to appt tomorrow with Dr Kennon Rounds but offered appt with me today for wound check. Patient verbalized understanding and states they will be here around 945.

## 2020-05-22 NOTE — Progress Notes (Signed)
Patient presents to office today for concern about possible wound infection and worsening pain. They had difficulty ambulating into office and getting on exam table due to severe pain. Skin around umbilicus is reddened 3-4 inches above and below & patient is tender to touch there. Umbilicus has scant drainage present and also appears reddened. Gaylan Gerold came in to assess incision & prescribed antibiotics for infection. Ibuprofen Rx sent in as well. Discussed with patient they will still need to come in tomorrow for appt with Dr Kennon Rounds. Patient verbalized understanding & had no questions.  Koren Bound RN BSN 05/22/20

## 2020-05-23 ENCOUNTER — Ambulatory Visit (INDEPENDENT_AMBULATORY_CARE_PROVIDER_SITE_OTHER): Payer: Medicaid Other | Admitting: Family Medicine

## 2020-05-23 ENCOUNTER — Encounter: Payer: Self-pay | Admitting: Family Medicine

## 2020-05-23 VITALS — BP 110/73 | HR 99 | Wt 292.0 lb

## 2020-05-23 DIAGNOSIS — R102 Pelvic and perineal pain: Secondary | ICD-10-CM | POA: Diagnosis not present

## 2020-05-23 DIAGNOSIS — T8141XA Infection following a procedure, superficial incisional surgical site, initial encounter: Secondary | ICD-10-CM | POA: Diagnosis not present

## 2020-05-23 DIAGNOSIS — Z5189 Encounter for other specified aftercare: Secondary | ICD-10-CM | POA: Diagnosis not present

## 2020-05-23 MED ORDER — IBUPROFEN 600 MG PO TABS: 600.0000 mg | ORAL_TABLET | Freq: Four times a day (QID) | ORAL | 1 refills | Status: DC | PRN

## 2020-05-23 MED ORDER — ONDANSETRON 4 MG PO TBDP: 4.0000 mg | ORAL_TABLET | Freq: Four times a day (QID) | ORAL | 0 refills | Status: DC | PRN

## 2020-05-23 NOTE — Assessment & Plan Note (Signed)
Has had one episode of PT, went well--continuing this, reviewed op note, photos, pathology today. No reason for her pain that is obvious. Presumed to be musculo-skeletal.

## 2020-05-23 NOTE — Progress Notes (Signed)
   Subjective:    Patient ID: Lakely Elmendorf is a 19 y.o. adult presenting with Post-op Follow-up  on 05/23/2020  HPI: Now 3 wks post surgery. The incision has been swelling more and more and then has gotten more painful and more erythematous. Placed on Abx yesterday. It has had an odor as well. Redness has improved since yesterday.  Review of Systems  Constitutional: Negative for chills and fever.  Respiratory: Negative for shortness of breath.   Cardiovascular: Negative for chest pain.  Gastrointestinal: Negative for abdominal pain, nausea and vomiting.  Genitourinary: Negative for dysuria.  Skin: Negative for rash.      Objective:    BP 110/73   Pulse 99   Wt 292 lb (132.5 kg)   LMP  (LMP Unknown)   BMI 50.12 kg/m  Physical Exam Constitutional:      General: Fidelia Cathers "[name]" is not in acute distress.    Appearance: Maciah Feeback "[name]" is well-developed.  HENT:     Head: Normocephalic and atraumatic.  Eyes:     General: No scleral icterus. Cardiovascular:     Rate and Rhythm: Normal rate.  Pulmonary:     Effort: Pulmonary effort is normal.  Abdominal:     Palpations: Abdomen is soft.  Musculoskeletal:     Cervical back: Neck supple.  Skin:    General: Skin is warm and dry.  Neurological:     Mental Status: Carmalita [place] "[name]" is alert and oriented to person, place, and time.    Media Information   Treated umbilicus with AgNO3.     Assessment & Plan:   Problem List Items Addressed This Visit      Unprioritized   Pelvic pain    Has had one episode of PT, went well--continuing this, reviewed op note, photos, pathology today. No reason for her pain that is obvious. Presumed to be musculo-skeletal.       Other Visit Diagnoses    Infection of superficial incisional surgical site after procedure, initial encounter    -  Primary   improved in appearance from yesterday. Continue cefdinir.   Relevant Medications   ibuprofen (ADVIL) 600 MG tablet   ondansetron  (ZOFRAN ODT) 4 MG disintegrating tablet      Total time in review of prior notes, pathology, labs, history taking, review with patient, exam, note writing, discussion of options, plan for next steps, alternatives and risks of treatment: 21 minutes.  Return in about 1 week (around 05/30/2020), or with nurse for incision check only.  Donnamae Jude 05/23/2020 5:12 PM

## 2020-05-24 ENCOUNTER — Other Ambulatory Visit: Payer: Self-pay

## 2020-05-24 ENCOUNTER — Encounter: Payer: Self-pay | Admitting: Physical Therapy

## 2020-05-24 ENCOUNTER — Ambulatory Visit: Payer: Medicaid Other | Admitting: Physical Therapy

## 2020-05-24 DIAGNOSIS — M545 Low back pain, unspecified: Secondary | ICD-10-CM

## 2020-05-24 DIAGNOSIS — R252 Cramp and spasm: Secondary | ICD-10-CM

## 2020-05-24 DIAGNOSIS — R278 Other lack of coordination: Secondary | ICD-10-CM | POA: Diagnosis not present

## 2020-05-24 DIAGNOSIS — M6281 Muscle weakness (generalized): Secondary | ICD-10-CM | POA: Diagnosis not present

## 2020-05-24 NOTE — Patient Instructions (Signed)
Access Code: [email or web address] URL: [email or web address] Date: 05/24/2020 Prepared by: Earlie Counts  Exercises Supine Butterfly Groin Stretch - 1 x daily - 7 x weekly - 1 sets - 1 reps - 30-60 sec hold Supine Lower Trunk Rotation - 1 x daily - 7 x weekly - 1 sets - 2 reps - 30 sec hold Reclined Diaphragmatic Breathing - 2 x daily - 7 x weekly - 1 sets - 10 reps - 5 sec hold [facility] [address], [place], [place] [address] Phone # [phone] Fax [phone]
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
## 2020-05-24 NOTE — Progress Notes (Signed)
PHQ-9 and GAD-7 positive; denies SI. Pt reports seeing a therapist a long time ago; did not find this helpful. Offered our [facility] services; pt declined. Encouraged pt to follow up with our office if this is something they are interested in. Pt reports talking with friends about emotional/mental health and finds this helpful.  Apolonio Schneiders RN 05/24/20

## 2020-05-25 ENCOUNTER — Emergency Department (HOSPITAL_COMMUNITY)
Admission: EM | Admit: 2020-05-25 | Discharge: 2020-05-26 | Disposition: A | Discharge status: Home / Self Care | Payer: Medicaid Other | Attending: Emergency Medicine | Admitting: Emergency Medicine

## 2020-05-25 ENCOUNTER — Other Ambulatory Visit: Payer: Self-pay

## 2020-05-25 ENCOUNTER — Encounter (HOSPITAL_COMMUNITY): Payer: Self-pay | Admitting: Emergency Medicine

## 2020-05-25 DIAGNOSIS — J45909 Unspecified asthma, uncomplicated: Secondary | ICD-10-CM | POA: Insufficient documentation

## 2020-05-25 DIAGNOSIS — R109 Unspecified abdominal pain: Secondary | ICD-10-CM | POA: Diagnosis not present

## 2020-05-25 DIAGNOSIS — R1033 Periumbilical pain: Secondary | ICD-10-CM | POA: Diagnosis present

## 2020-05-25 DIAGNOSIS — L02211 Cutaneous abscess of abdominal wall: Secondary | ICD-10-CM | POA: Diagnosis not present

## 2020-05-25 DIAGNOSIS — L02216 Cutaneous abscess of umbilicus: Secondary | ICD-10-CM | POA: Diagnosis not present

## 2020-05-25 DIAGNOSIS — Z7951 Long term (current) use of inhaled steroids: Secondary | ICD-10-CM | POA: Diagnosis not present

## 2020-05-25 DIAGNOSIS — K219 Gastro-esophageal reflux disease without esophagitis: Secondary | ICD-10-CM | POA: Insufficient documentation

## 2020-05-25 DIAGNOSIS — R7401 Elevation of levels of liver transaminase levels: Secondary | ICD-10-CM | POA: Diagnosis present

## 2020-05-25 DIAGNOSIS — L03316 Cellulitis of umbilicus: Secondary | ICD-10-CM | POA: Diagnosis not present

## 2020-05-25 DIAGNOSIS — T8141XD Infection following a procedure, superficial incisional surgical site, subsequent encounter: Secondary | ICD-10-CM | POA: Diagnosis not present

## 2020-05-25 DIAGNOSIS — T8149XA Infection following a procedure, other surgical site, initial encounter: Secondary | ICD-10-CM | POA: Diagnosis present

## 2020-05-25 DIAGNOSIS — T8140XA Infection following a procedure, unspecified, initial encounter: Secondary | ICD-10-CM | POA: Diagnosis present

## 2020-05-25 DIAGNOSIS — L0291 Cutaneous abscess, unspecified: Secondary | ICD-10-CM

## 2020-05-25 DIAGNOSIS — R188 Other ascites: Secondary | ICD-10-CM | POA: Diagnosis not present

## 2020-05-25 LAB — LIPASE, BLOOD: Lipase: 20 U/L (ref 11–51)

## 2020-05-25 LAB — CBC
HCT: 39.6 % (ref 36.0–46.0)
Hemoglobin: 13.3 g/dL (ref 12.0–15.0)
MCH: 29.3 pg (ref 26.0–34.0)
MCHC: 33.6 g/dL (ref 30.0–36.0)
MCV: 87.2 fL (ref 80.0–100.0)
Platelets: 411 10*3/uL — ABNORMAL HIGH (ref 150–400)
RBC: 4.54 MIL/uL (ref 3.87–5.11)
RDW: 13.6 % (ref 11.5–15.5)
WBC: 15.1 10*3/uL — ABNORMAL HIGH (ref 4.0–10.5)
nRBC: 0 % (ref 0.0–0.2)

## 2020-05-25 LAB — URINALYSIS, ROUTINE W REFLEX MICROSCOPIC
Bacteria, UA: NONE SEEN
Bilirubin Urine: NEGATIVE
Glucose, UA: NEGATIVE mg/dL
Hgb urine dipstick: NEGATIVE
Ketones, ur: 5 mg/dL — AB
Leukocytes,Ua: NEGATIVE
Nitrite: NEGATIVE
Protein, ur: 30 mg/dL — AB
Specific Gravity, Urine: 1.025 (ref 1.005–1.030)
pH: 5 (ref 5.0–8.0)

## 2020-05-25 LAB — COMPREHENSIVE METABOLIC PANEL
ALT: 69 U/L — ABNORMAL HIGH (ref 0–44)
AST: 37 U/L (ref 15–41)
Albumin: 3.7 g/dL (ref 3.5–5.0)
Alkaline Phosphatase: 114 U/L (ref 38–126)
Anion gap: 13 (ref 5–15)
BUN: 10 mg/dL (ref 6–20)
CO2: 22 mmol/L (ref 22–32)
Calcium: 9 mg/dL (ref 8.9–10.3)
Chloride: 104 mmol/L (ref 98–111)
Creatinine, Ser: 0.67 mg/dL (ref 0.44–1.00)
GFR, Estimated: >60 mL/min (ref >60)
Glucose, Bld: 89 mg/dL (ref 70–99)
Potassium: 3.6 mmol/L (ref 3.5–5.1)
Sodium: 139 mmol/L (ref 135–145)
Total Bilirubin: 1.1 mg/dL (ref 0.3–1.2)
Total Protein: 7.2 g/dL (ref 6.5–8.1)

## 2020-05-25 LAB — I-STAT BETA HCG BLOOD, ED (MC, WL, AP ONLY): I-stat hCG, quantitative: <5 m[IU]/mL (ref <5)

## 2020-05-25 NOTE — ED Triage Notes (Signed)
Patient arrives to ED with complaints of abdominal pain/pelvic pain x1 week, Pt was dx with infection of superficial infection site after her diagnostic laparoscopy. Surgical site infection is in navel. Pt is currently taking antibiotics but today she started to have brown liquid coming out of her surgical site. Pain was increased today.

## 2020-05-26 ENCOUNTER — Emergency Department (HOSPITAL_COMMUNITY): Payer: Medicaid Other

## 2020-05-26 DIAGNOSIS — T8141XD Infection following a procedure, superficial incisional surgical site, subsequent encounter: Secondary | ICD-10-CM | POA: Diagnosis not present

## 2020-05-26 DIAGNOSIS — T8149XA Infection following a procedure, other surgical site, initial encounter: Secondary | ICD-10-CM | POA: Diagnosis present

## 2020-05-26 DIAGNOSIS — R7401 Elevation of levels of liver transaminase levels: Secondary | ICD-10-CM | POA: Diagnosis present

## 2020-05-26 DIAGNOSIS — L02211 Cutaneous abscess of abdominal wall: Secondary | ICD-10-CM | POA: Diagnosis not present

## 2020-05-26 DIAGNOSIS — R188 Other ascites: Secondary | ICD-10-CM | POA: Diagnosis not present

## 2020-05-26 DIAGNOSIS — R109 Unspecified abdominal pain: Secondary | ICD-10-CM | POA: Diagnosis not present

## 2020-05-26 DIAGNOSIS — T8140XA Infection following a procedure, unspecified, initial encounter: Secondary | ICD-10-CM | POA: Diagnosis present

## 2020-05-26 LAB — LACTIC ACID, PLASMA: Lactic Acid, Venous: 0.9 mmol/L (ref 0.5–1.9)

## 2020-05-26 MED ORDER — FAMOTIDINE IN NACL 20-0.9 MG/50ML-% IV SOLN
20.0000 mg | INTRAVENOUS | Status: AC
  Administered 2020-05-26: 20 mg via INTRAVENOUS
  Filled 2020-05-26: qty 50

## 2020-05-26 MED ORDER — DOXYCYCLINE HYCLATE 100 MG PO CAPS: 100.0000 mg | ORAL_CAPSULE | Freq: Two times a day (BID) | ORAL | 0 refills | Status: DC

## 2020-05-26 MED ORDER — DOXYCYCLINE HYCLATE 100 MG PO TABS
100.0000 mg | ORAL_TABLET | Freq: Once | ORAL | Status: AC
  Administered 2020-05-26: 100 mg via ORAL
  Filled 2020-05-26: qty 1

## 2020-05-26 MED ORDER — IOHEXOL 300 MG/ML  SOLN
125.0000 mL | Freq: Once | INTRAMUSCULAR | Status: AC | PRN
  Administered 2020-05-26: 125 mL via INTRAVENOUS

## 2020-05-26 NOTE — ED Notes (Signed)
Pt reports abdominal tenderness, copious brown/red tinged liquid flowed from navel. Gauze placed on abdomen.

## 2020-05-26 NOTE — ED Provider Notes (Signed)
Accepted handoff at shift change from [facility]. Please see prior provider note for more detail.   Briefly: Patient is 19 y.o. female had exploratory laparotomy done on 11/17 now with abdominal abscess to be evaluated by OB/GYN  Plan: FU on OBGYN recommendations   Physical Exam  BP 128/80   Pulse 84   Temp 99.2 F (37.3 C) (Oral)   Resp (!) 25   Ht 5\' 4"  (1.626 m)   Wt 131.3 kg   LMP  (LMP Unknown)   SpO2 98%   BMI 49.68 kg/m   Physical Exam  ED Course/Procedures     Procedures  Results for orders placed or performed during the hospital encounter of 05/25/20  Lipase, blood  Result Value Ref Range   Lipase 20 11 - 51 U/L  Comprehensive metabolic panel  Result Value Ref Range   Sodium 139 135 - 145 mmol/L   Potassium 3.6 3.5 - 5.1 mmol/L   Chloride 104 98 - 111 mmol/L   CO2 22 22 - 32 mmol/L   Glucose, Bld 89 70 - 99 mg/dL   BUN 10 6 - 20 mg/dL   Creatinine, Ser 0.67 0.44 - 1.00 mg/dL   Calcium 9.0 8.9 - 10.3 mg/dL   Total Protein 7.2 6.5 - 8.1 g/dL   Albumin 3.7 3.5 - 5.0 g/dL   AST 37 15 - 41 U/L   ALT 69 (H) 0 - 44 U/L   Alkaline Phosphatase 114 38 - 126 U/L   Total Bilirubin 1.1 0.3 - 1.2 mg/dL   GFR, Estimated >60 >60 mL/min   Anion gap 13 5 - 15  CBC  Result Value Ref Range   WBC 15.1 (H) 4.0 - 10.5 K/uL   RBC 4.54 3.87 - 5.11 MIL/uL   Hemoglobin 13.3 12.0 - 15.0 g/dL   HCT 39.6 36.0 - 46.0 %   MCV 87.2 80.0 - 100.0 fL   MCH 29.3 26.0 - 34.0 pg   MCHC 33.6 30.0 - 36.0 g/dL   RDW 13.6 11.5 - 15.5 %   Platelets 411 (H) 150 - 400 K/uL   nRBC 0.0 0.0 - 0.2 %  Urinalysis, Routine w reflex microscopic Urine, Clean Catch  Result Value Ref Range   Color, Urine AMBER (A) YELLOW   APPearance HAZY (A) CLEAR   Specific Gravity, Urine 1.025 1.005 - 1.030   pH 5.0 5.0 - 8.0   Glucose, UA NEGATIVE NEGATIVE mg/dL   Hgb urine dipstick NEGATIVE NEGATIVE   Bilirubin Urine NEGATIVE NEGATIVE   Ketones, ur 5 (A) NEGATIVE mg/dL   Protein, ur 30 (A) NEGATIVE  mg/dL   Nitrite NEGATIVE NEGATIVE   Leukocytes,Ua NEGATIVE NEGATIVE   RBC / HPF 0-5 0 - 5 RBC/hpf   WBC, UA 0-5 0 - 5 WBC/hpf   Bacteria, UA NONE SEEN NONE SEEN   Squamous Epithelial / LPF 0-5 0 - 5   Mucus PRESENT   Lactic acid, plasma  Result Value Ref Range   Lactic Acid, Venous 0.9 0.5 - 1.9 mmol/L  I-Stat beta hCG blood, ED  Result Value Ref Range   I-stat hCG, quantitative <5.0 <5 mIU/mL   Comment 3           CT ABDOMEN PELVIS W CONTRAST  Result Date: 05/26/2020 CLINICAL DATA:  Wound infection EXAM: CT ABDOMEN AND PELVIS WITH CONTRAST TECHNIQUE: Multidetector CT imaging of the abdomen and pelvis was performed using the standard protocol following bolus administration of intravenous contrast. CONTRAST:  116mL OMNIPAQUE IOHEXOL 300  MG/ML  SOLN COMPARISON:  MRI pelvis 02/02/2020 FINDINGS: Lower chest: Lung bases are clear. Hepatobiliary: No focal hepatic lesion. No biliary duct dilatation. Common bile duct is normal. Pancreas: Pancreas is normal. No ductal dilatation. No pancreatic inflammation. Spleen: Normal spleen Adrenals/urinary tract: Adrenal glands and kidneys are normal. The ureters and bladder normal. Stomach/Bowel: Stomach, small bowel, appendix, and cecum are normal. The colon and rectosigmoid colon are normal. Vascular/Lymphatic: Abdominal aorta is normal caliber. No periportal or retroperitoneal adenopathy. No pelvic adenopathy. Reproductive: Other: Tubular fluid collection positioned between the umbilicus and the ventral abdominal wall fascial layer. Collection measures 3.8 x 3.7 cm in sagittal mention (image 66/7) and 2.9 cm in axial dimension (image 54/3). Enhancement along the borders of this fluid collection. The fluid collection approximates the linea alba and LEFT rectus muscle but does not appear to extend into the peritoneal space. Musculoskeletal: No aggressive osseous lesion. IMPRESSION: Subcutaneous abscess position between the umbilicus and ventral abdominal wall  fascia layer without evidence of extension into the peritoneal space. Electronically Signed   By: Suzy Bouchard M.D.   On: 05/26/2020 06:25     MDM   Discussed with Dr. Ilda Basset of OB/GYN who assessed patient at bedside and recommends doxycycline twice daily 100 mg for 10 days she will follow-up in the clinic.  I discussed with patient bedside she is understanding of plan she is provided with first dose of doxycycline here and discharged with prescription.  All questions answered the best my ability.      Heidi [name], [place] 05/26/20 0740    Ripley Fraise, MD 05/26/20 [phone]

## 2020-05-26 NOTE — Discharge Instructions (Addendum)
Please follow-up with your OB/GYN for your scheduled appointment.  Please take the antibiotic I prescribed you doxycycline twice daily for the next 10 days.  Please take for the entire prescribed course even if you feel improved after partial treatment.  Please eat food prior to consuming antibiotic.  You may always return to the ER for any new or concerning symptoms.  Warm compresses applied 3-4 times daily are helpful for resolution of abscess.

## 2020-05-26 NOTE — ED Notes (Signed)
Patient transported to CT 
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
## 2020-05-26 NOTE — ED Notes (Signed)
Atttempted IV without success

## 2020-05-26 NOTE — Consult Note (Addendum)
Gynecology Consult Note  Date of Consult: 05/26/2020   Requesting Provider: [facility]  Primary OBGYN: [facility] Kennon Rounds)  Reason for Admission: umbilical infection  History of Present Illness: Ms. Wolfer is a 19 y.o.  (No LMP recorded (lmp unknown). Patient has had an implant.), with the above CC. PMHx is significant for s/p 11/17 dx l/s and seen last week and dx with umbilical infection and started on abx, BMI 50.  Pt had uncomplicated d/x l/s on 10/62 for pelvic pain and was discharged home from the pacu.  She contacted the office on 12/7 for concern for pain and erythema and d/c from the umbilicus that started a few days prior. The nurse recommended neosporin and she added her on for an office visit the next day. She saw Dr. Kennon Rounds on 12/8 and she started her on duricef, which she states she taking.  At that visit, no mention of drainage   Pt presents to the ED due to worsened drainage and continued pain and erythema from the umblicus.   ED work up shows 4cm SQ abscess below the umbilicus and a leukocytosis; no fever   ROS: A 12-point review of systems was performed and negative, except as stated in the above HPI.  OBGYN History: As per HPI. OB History  No obstetric history on file.     Past Medical History: Past Medical History:  Diagnosis Date  . ADHD (attention deficit hyperactivity disorder)   . Allergy    cats,  . Anxiety   . Asthma due to seasonal allergies   . Complication of anesthesia   . Cough variant asthma   . Depression   . Episodic mood disorder ([place])   . Family history of adverse reaction to anesthesia    mother was once combative and had PONV  . Family history of kidney cancer   . Family history of lung cancer   . Family history of uterine cancer   . Gastric ulcer   . GERD (gastroesophageal reflux disease)   . Hemorrhagic ovarian cyst    Left hemorrhagic ovarian cyst per mother  . History of Chiari malformation      s/p decompression, C1 laminectomy 09/30/2019 at [facility]  . Multinodular thyroid   . Obesity   . PONV (postoperative nausea and vomiting)   . PTSD (post-traumatic stress disorder)    per mother  . Recurrent upper respiratory infection (URI)     Past Surgical History: Past Surgical History:  Procedure Laterality Date  . ABCESS DRAINAGE Right   . COLONOSCOPY N/A 11/04/2016   Procedure: COLONOSCOPY;  Surgeon: Joycelyn Rua, MD;  Location: [place];  Service: Gastroenterology;  Laterality: N/A;  . CRANIECTOMY  09/2019   Subocciptal craniectomy, C1 laminectomy.  . ESOPHAGOGASTRODUODENOSCOPY N/A 11/04/2016   Procedure: ESOPHAGOGASTRODUODENOSCOPY (EGD);  Surgeon: Joycelyn Rua, MD;  Location: [place];  Service: Gastroenterology;  Laterality: N/A;  . LAPAROSCOPY N/A 05/02/2020   Procedure: LAPAROSCOPY DIAGNOSTIC WITH BIOPSIES;  Surgeon: Donnamae Jude, MD;  Location: [place];  Service: Gynecology;  Laterality: N/A;  . SUPPRELIN IMPLANT Left 04/25/2019   Procedure: SUPPRELIN IMPLANT PEDIATRIC;  Surgeon: Stanford Scotland, MD;  Location: [place];  Service: Pediatrics;  Laterality: Left;  . THYROIDECTOMY N/A 01/26/2019   Procedure: TOTAL THYROIDECTOMY;  Surgeon: Fredirick Maudlin, MD;  Location: [facility];  Service: General;  Laterality: N/A;    Family History:  Family History  Problem Relation Age of Onset  . Diabetes Father   .  Depression Father   . Heart disease Father        3 MI, triple bipass  . Hyperlipidemia Father   . Hypertension Father   . Learning disabilities Father        ADD/ADHD  . Mental illness Father        bipolar  . Vision loss Father        lens replacement surgery  . Allergic rhinitis Father   . Anxiety disorder Father   . Bipolar disorder Father   . ADD / ADHD Father   . Asthma Mother   . Arthritis Mother   . Depression Mother   . Allergic rhinitis Mother   . Urticaria Mother   . Thyroid cancer Mother 46       papillary  . Asthma  Brother   . Learning disabilities Brother        disorder of written expression  . Allergic rhinitis Brother   . ADD / ADHD Brother   . Kidney disease Maternal Grandmother   . Miscarriages / Stillbirths Maternal Grandmother   . Kidney cancer Maternal Grandmother 29       "transitional cell"  . Alcohol abuse Maternal Grandfather   . Mental illness Maternal Grandfather        Alzheimers, Vascular Damention  . Arthritis Paternal Grandmother   . COPD Paternal Grandmother   . COPD Paternal Grandfather   . Immunodeficiency Paternal Grandfather   . Drug abuse Maternal Uncle   . Early death Maternal Uncle   . Migraines Cousin   . Kidney cancer Other        diagnosed in his 30s  . Lung cancer Maternal Uncle 56       rare subtype of small cell lung cancer  . Thyroid cancer Maternal Great-grandmother 61  . Uterine cancer Maternal Great-grandmother 83  . Birth defects Neg Hx   . Hearing loss Neg Hx   . Mental retardation Neg Hx   . Stroke Neg Hx   . Varicose Veins Neg Hx   . Angioedema Neg Hx   . Eczema Neg Hx   . Seizures Neg Hx   . Schizophrenia Neg Hx   . Autism Neg Hx     Social History:  Social History   Socioeconomic History  . Marital status: Significant Other    Spouse name: Not on file  . Number of children: Not on file  . Years of education: Not on file  . Highest education level: Not on file  Occupational History  . Not on file  Tobacco Use  . Smoking status: Never Smoker  . Smokeless tobacco: Never Used  Vaping Use  . Vaping Use: Never used  Substance and Sexual Activity  . Alcohol use: No  . Drug use: No  . Sexual activity: Never  Other Topics Concern  . Not on file  Social History Narrative   Brigitta is a 11th grade at [employer]; has not been to school in over a month. 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 She lives with her parents. She enjoys playing with friends online, drawing, and YouTube.       504 in school.       Sees Dr.  Darleene Cleaver      Identifies at [place]   Prefers pronouns- they/them   Social Determinants of Health   Financial Resource Strain: Not on file  Food Insecurity: Food Insecurity Present  . Worried About Charity fundraiser in the Last Year: Sometimes  true  . Ran Out of Food in the Last Year: Never true  Transportation Needs: No Transportation Needs  . Lack of Transportation (Medical): No  . Lack of Transportation (Non-Medical): No  Physical Activity: Not on file  Stress: Not on file  Social Connections: Not on file  Intimate Partner Violence: Not on file     Allergy: Allergies  Allergen Reactions  . Other Other (See Comments)    Cats, seasonal, mom/gm   . Sulfa Antibiotics Other (See Comments)    Other, family history of allergy    Current Outpatient Medications: (Not in a hospital admission)    Hospital Medications: No current facility-administered medications for this encounter.   Current Outpatient Medications  Medication Sig Dispense Refill  . albuterol (VENTOLIN HFA) 108 (90 Base) MCG/ACT inhaler Inhale 2 puffs into the lungs every 4 (four) hours as needed. (Patient taking differently: Inhale 2 puffs into the lungs every 4 (four) hours as needed for shortness of breath.) 18 g 1  . cefadroxil (DURICEF) 500 MG capsule Take 1 capsule (500 mg total) by mouth 2 (two) times daily for 5 days. 10 capsule 0  . Cholecalciferol 25 MCG (1000 UT) tablet Take 2,000 Units by mouth at bedtime.     . cloNIDine HCl (KAPVAY) 0.1 MG TB12 ER tablet Take 0.2 mg by mouth daily.   1  . Coenzyme Q10 (COQ10) 100 MG CAPS Take 100 mg by mouth 2 (two) times daily. (Patient taking differently: Take 100 mg by mouth at bedtime.) 60 each 2  . cyclobenzaprine (FLEXERIL) 10 MG tablet Take 1 tablet (10 mg total) by mouth every 8 (eight) hours as needed for muscle spasms. 30 tablet 1  . fluticasone (FLOVENT HFA) 44 MCG/ACT inhaler Inhale 2 puffs into the lungs 2 (two) times daily. (Patient taking  differently: Inhale 2 puffs into the lungs 2 (two) times daily as needed (shortness of breath).) 1 Inhaler 5  . ibuprofen (ADVIL) 600 MG tablet Take 1 tablet (600 mg total) by mouth every 6 (six) hours as needed. (Patient taking differently: Take 600 mg by mouth every 6 (six) hours as needed for mild pain.) 90 tablet 1  . KARBINAL ER 4 MG/5ML SUER TAKE 7 AND 1/2 MLS TO 20 MLS (6MG  TO 16 MG) BY MOUTH 2 TIMES DAILY AS NEEDED. (Patient taking differently: Take 6-16 mg by mouth 2 (two) times daily as needed (allergies).) 480 mL 3  . letrozole (FEMARA) 2.5 MG tablet Take 1 tablet (2.5 mg total) by mouth daily. 30 tablet 6  . levothyroxine (SYNTHROID) 150 MCG tablet TAKE 1 TABLET (150 MCG TOTAL) BY MOUTH DAILY AT 6 (SIX) AM. (Patient taking differently: Take 150 mcg by mouth daily before breakfast.) 30 tablet 5  . loperamide (IMODIUM) 2 MG capsule Take 2 mg by mouth as needed for diarrhea or loose stools.    . ondansetron (ZOFRAN ODT) 4 MG disintegrating tablet Take 1 tablet (4 mg total) by mouth every 6 (six) hours as needed for nausea. 20 tablet 0  . oxcarbazepine (TRILEPTAL) 600 MG tablet Take 900 mg by mouth 2 (two) times daily.   1  . oxyCODONE (OXY IR/ROXICODONE) 5 MG immediate release tablet Take 1 tablet (5 mg total) by mouth every 6 (six) hours as needed for severe pain. 8 tablet 0  . pantoprazole (PROTONIX) 40 MG tablet Take 1 tablet (40 mg total) by mouth daily before breakfast. (Patient taking differently: Take 40 mg by mouth at bedtime.) 90 tablet 3  . VYVANSE  70 MG capsule Take 70 mg by mouth daily.  0     Physical Exam:   Current Vital Signs 24h Vital Sign Ranges  T 99.2 F (37.3 C) Temp  Avg: 99.1 F (37.3 C)  Min: 99 F (37.2 C)  Max: 99.2 F (37.3 C)  BP 128/80 BP  Min: 111/74  Max: 138/79  HR 84 Pulse  Avg: 87.2  Min: 80  Max: 95  RR (!) 25 Resp  Avg: 21.7  Min: 16  Max: 27  SaO2 98 % Room Air SpO2  Avg: 98.1 %  Min: 95 %  Max: 100 %       24 Hour I/O Current Shift I/O   Time Ins Outs No intake/output data recorded. No intake/output data recorded.   Patient Vitals for the past 24 hrs:  BP Temp Temp src Pulse Resp SpO2 Height Weight  05/26/20 0715 128/80 -- -- 84 (!) 25 98 % -- --  05/26/20 0700 120/76 -- -- 80 (!) 22 99 % -- --  05/26/20 0645 122/80 -- -- 85 (!) 24 99 % -- --  05/26/20 0630 111/74 -- -- 84 (!) 27 100 % -- --  05/26/20 0530 (!) 122/98 -- -- 85 (!) 23 97 % -- --  05/26/20 0445 138/79 -- -- 84 (!) 25 96 % -- --  05/26/20 0350 133/82 -- -- 91 (!) 21 98 % -- --  05/26/20 0257 129/84 -- -- 95 16 99 % -- --  05/26/20 0024 120/77 -- -- 93 16 99 % -- --  05/25/20 1841 124/84 99.2 F (37.3 C) Oral 89 18 99 % -- --  05/25/20 1709 112/67 99 F (37.2 C) Oral 89 (!) 22 95 % 5\' 4"  (1.626 m) 131.3 kg    Body mass index is 49.68 kg/m. General appearance: Well nourished, well developed female in no acute distress.  Respiratory: Normal respiratory effort Abdomen: obese, minimally ttp. Erythema dimensions are the same as on 38/1 but at the umbilicus and surrounding it, it is more red. The umbilicus is spontaneously draining, non foul smelling yellowish-brown fluid. I don't see an obvious opening.  Neuro/Psych:  Normal mood and affect.  Skin:  Warm and dry.  Extremities: no clubbing, cyanosis, or edema.   Laboratory: Pending: culture swab  Recent Labs  Lab 05/25/20 1735  WBC 15.1*  HGB 13.3  HCT 39.6  PLT 411*   Recent Labs  Lab 05/25/20 1735  NA 139  K 3.6  CL 104  CO2 22  BUN 10  CREATININE 0.67  CALCIUM 9.0  PROT 7.2  BILITOT 1.1  ALKPHOS 114  ALT 69*  AST 37  GLUCOSE 89    Imaging:  Narrative & Impression  CLINICAL DATA:  Wound infection  EXAM: CT ABDOMEN AND PELVIS WITH CONTRAST  TECHNIQUE: Multidetector CT imaging of the abdomen and pelvis was performed using the standard protocol following bolus administration of intravenous contrast.  CONTRAST:  166mL OMNIPAQUE IOHEXOL 300 MG/ML  SOLN  COMPARISON:   MRI pelvis 02/02/2020  FINDINGS: Lower chest: Lung bases are clear.  Hepatobiliary: No focal hepatic lesion. No biliary duct dilatation. Common bile duct is normal.  Pancreas: Pancreas is normal. No ductal dilatation. No pancreatic inflammation.  Spleen: Normal spleen  Adrenals/urinary tract: Adrenal glands and kidneys are normal. The ureters and bladder normal.  Stomach/Bowel: Stomach, small bowel, appendix, and cecum are normal. The colon and rectosigmoid colon are normal.  Vascular/Lymphatic: Abdominal aorta is normal caliber. No periportal or retroperitoneal adenopathy.  No pelvic adenopathy.  Reproductive:  Other: Tubular fluid collection positioned between the umbilicus and the ventral abdominal wall fascial layer. Collection measures 3.8 x 3.7 cm in sagittal mention (image 66/7) and 2.9 cm in axial dimension (image 54/3). Enhancement along the borders of this fluid collection. The fluid collection approximates the linea alba and LEFT rectus muscle but does not appear to extend into the peritoneal space.  Musculoskeletal: No aggressive osseous lesion.  IMPRESSION: Subcutaneous abscess position between the umbilicus and ventral abdominal wall fascia layer without evidence of extension into the peritoneal space.   Electronically Signed   By: Suzy Bouchard M.D.   On: 05/26/2020 06:25     Assessment: Ms. Sigmund is a 19 y.o. with SQ infection. Pt stable  Plan: Recommend switching abx to cover mrsa. Pt states mom and aunt have sulfa allergy so she does not want to do sulfa. Will do doxy 100 bid x 10d. Pt already has appt on 12/16 to look at the area again. I recommended she draw an outline of the erythema to keep an eye on it. I also encouraged her to do measures to help it drain like warm showers and warm compresses.   Total time taking care of the patient was 30 minutes, with greater than 50% of the time spent in face to face interaction with the  patient.  Durene Romans MD Attending [facility])

## 2020-05-28 DIAGNOSIS — F3481 Disruptive mood dysregulation disorder: Secondary | ICD-10-CM | POA: Diagnosis not present

## 2020-05-28 DIAGNOSIS — F0630 Mood disorder due to known physiological condition, unspecified: Secondary | ICD-10-CM | POA: Diagnosis not present

## 2020-05-28 DIAGNOSIS — F902 Attention-deficit hyperactivity disorder, combined type: Secondary | ICD-10-CM | POA: Diagnosis not present

## 2020-05-28 LAB — AEROBIC CULTURE W GRAM STAIN (SUPERFICIAL SPECIMEN): Culture: NORMAL

## 2020-05-29 ENCOUNTER — Telehealth: Payer: Self-pay | Admitting: Emergency Medicine

## 2020-05-29 DIAGNOSIS — F89 Unspecified disorder of psychological development: Secondary | ICD-10-CM | POA: Diagnosis not present

## 2020-05-29 NOTE — Telephone Encounter (Signed)
Post ED Visit - Positive Culture Follow-up  Culture report reviewed by antimicrobial stewardship pharmacist: [place] Team []  Elenor Quinones, Pharm.D. []  Heide Guile, Pharm.D., BCPS AQ-ID []  Parks Neptune, Pharm.D., BCPS []  Alycia Rossetti, Pharm.D., BCPS []  [place], [place].D., BCPS, AAHIVP []  Legrand Como, Pharm.D., BCPS, AAHIVP []  Salome Arnt, PharmD, BCPS []  Johnnette Gourd, PharmD, BCPS []  Hughes Better, PharmD, BCPS []  Leeroy Cha, PharmD []  Laqueta Linden, PharmD, BCPS []  Albertina Parr, PharmD Cristela Felt PharmD  [place] Team []  Leodis Sias, PharmD []  Lindell Spar, PharmD []  Royetta Asal, PharmD []  Graylin Shiver, Rph []  Rema Fendt) Glennon Mac, PharmD []  Arlyn Dunning, PharmD []  Netta Cedars, PharmD []  Dia Sitter, PharmD []  Leone Haven, PharmD []  Gretta Arab, PharmD []  Theodis Shove, PharmD []  Peggyann Juba, PharmD []  Reuel Boom, PharmD   Positive wound culture Treated with doxycycline, organism sensitive to the same and no further patient follow-up is required at this time.  Hazle Nordmann 05/29/2020, 1:24 PM

## 2020-05-31 ENCOUNTER — Other Ambulatory Visit: Payer: Self-pay

## 2020-05-31 ENCOUNTER — Ambulatory Visit (INDEPENDENT_AMBULATORY_CARE_PROVIDER_SITE_OTHER): Payer: Medicaid Other | Admitting: Obstetrics & Gynecology

## 2020-05-31 VITALS — BP 94/58 | HR 64 | Wt 289.9 lb

## 2020-05-31 DIAGNOSIS — Z4889 Encounter for other specified surgical aftercare: Secondary | ICD-10-CM

## 2020-05-31 NOTE — Progress Notes (Signed)
Pt here today for incision check s/p visit with Dr. Kennon Rounds on 05/23/20.  Per chart review pt went to the ED on 05/26/20 for leaking of brownish fluid from their belly button.  Pt reports that they was informed that she had an abscess that burst.  Assessed incision- healing well, no drainage, no erythema, and no edema.  Belly button appears to have herniated outside.  Notified Dr. Roselie Awkward who assessed patients belly button.  Provider recommendation to schedule an appt with Dr. Kennon Rounds for evaluation.  Pt advised to f/u with Dr. Kennon Rounds and to call the office with any concerns or questions.    Mel Almond, RN  05/31/20

## 2020-06-01 NOTE — Progress Notes (Signed)
Patient ID: Heidi [name], adult   DOB: 12-26-2000, 19 y.o.   MRN: [id number] Patient was assessed and managed by nursing staff during this encounter. I have reviewed the chart and agree with the documentation and plan. I have also made any necessary editorial changes.  Emeterio Reeve, MD 06/01/2020 7:19 PM

## 2020-06-06 ENCOUNTER — Encounter (INDEPENDENT_AMBULATORY_CARE_PROVIDER_SITE_OTHER): Payer: Self-pay | Admitting: Pediatric Endocrinology

## 2020-06-06 ENCOUNTER — Encounter (INDEPENDENT_AMBULATORY_CARE_PROVIDER_SITE_OTHER): Payer: Self-pay

## 2020-06-06 ENCOUNTER — Other Ambulatory Visit: Payer: Self-pay

## 2020-06-06 ENCOUNTER — Ambulatory Visit (INDEPENDENT_AMBULATORY_CARE_PROVIDER_SITE_OTHER): Payer: Medicaid Other | Admitting: Pediatric Endocrinology

## 2020-06-06 VITALS — BP 122/82 | HR 76 | Ht 64.96 in | Wt 291.8 lb

## 2020-06-06 DIAGNOSIS — R7401 Elevation of levels of liver transaminase levels: Secondary | ICD-10-CM | POA: Diagnosis not present

## 2020-06-06 DIAGNOSIS — E8881 Metabolic syndrome: Secondary | ICD-10-CM

## 2020-06-06 DIAGNOSIS — E890 Postprocedural hypothyroidism: Secondary | ICD-10-CM | POA: Diagnosis not present

## 2020-06-06 DIAGNOSIS — N946 Dysmenorrhea, unspecified: Secondary | ICD-10-CM | POA: Diagnosis not present

## 2020-06-06 DIAGNOSIS — Z68.41 Body mass index (BMI) pediatric, greater than or equal to 95th percentile for age: Secondary | ICD-10-CM

## 2020-06-06 DIAGNOSIS — F640 Transsexualism: Secondary | ICD-10-CM

## 2020-06-06 LAB — POCT GLUCOSE (DEVICE FOR HOME USE): POC Glucose: 112 mg/dl — AB (ref 70–99)

## 2020-06-06 LAB — POCT GLYCOSYLATED HEMOGLOBIN (HGB A1C)

## 2020-06-06 MED ORDER — LIDOCAINE-PRILOCAINE 2.5-2.5 % EX CREA: TOPICAL_CREAM | CUTANEOUS | 0 refills | Status: DC

## 2020-06-06 MED ORDER — TESTOSTERONE 12.5 MG/ACT (1%) TD GEL: TRANSDERMAL | 1 refills | Status: DC

## 2020-06-06 NOTE — Patient Instructions (Signed)
Start Androgel (after it gets approved) 1 pump each underarm before bed.   Labs today for thyroid levels and baseline before starting T.   Will plan to leave your implant in for now and probably remove it next summer.

## 2020-06-06 NOTE — Progress Notes (Signed)
Subjective:  Subjective  Patient Name: Heidi [name] Date of Birth: May 24, 2001  MRN: [id number]  Marleni Gallardo  Presents to our office today for follow up evaluation and management of Heidi [name] "Heidi [name]"'s weight gain, abnormal menses. They has post surgical hypothyroidism. (thyroidectomy for multinodular goiter with fm hx PTC)   HISTORY OF PRESENT ILLNESS:   Dayane is a 19 y.o. Caucasian female   [name] was accompanied by their mom   1. Cambree was seen by their adolescent medicine specialist and their neurologist in the fall of 2019. They was having issues with weight gain, dysmenorrhea, lipid abnormalities, and migraines. 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    2 Heidi [name] was last seen in pediatric endocrine clinic on 03/05/20. In the interim they have been doing better.  Heidi [name] had the diagnostic laparoscopy last month and ended up with an abscess. There was brown goo draining from their navel. They are still on antibiotics but are definitely improving at this point.   They removed a para tubular cysts and part of the fallopian tube. They did not find any other source for pelvic pain. They are starting pelvic PT.   They have some new exercises and meditations that they are meant to be doing.   Mom is having symptomatic PVCs. Dad had a 3rd stroke and had above the knee amputations.    Thyroid  They have continued on 150 mcg of Levothyroxine daily. They will sometimes forget to take it. They are taking it in the morning. They have an alarm set for it. They always wake up to the alarm but sometimes get side tracked first.   Sleep is good. Energy level is good.    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 It has been there for about 1 year. They feel that it is still working. They have not had menses or menstrual pain. They would like to start Testosterone but are very needle phobic. They have a lot of questions about topical testosterone.   Dysphoria remains  due to breasts. They were denied for a reduction by insurance (mom talked them down from getting full female reconstruction).   Pre Diabetes   Appetite has been fluctuating.  They have been drinking a lot of propel or gatorade 0 calorie waters.  They have been eating a lot of fast food- they "don't have enough money" to eat healthier. They do like the [employer] cookies.   They have not been very active since last visit. They are trying to walk some. They do not currently have a therapist. They say that they talk with their friends a lot.   ______  They had Chairi repair - decompression and laminectomy of C1. Since then the episodic migraines have resolved.  Heidi [name] is so happy to no longer have headaches.   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   S/P SOC for decompression, C1 laminectomy, duraplasty (09/30/2019). Done by Dr Camila Li at [facility]  They had their thyroidectomy on 01/26/19 with Dr. Celine Ahr.  They had a Supprelin implant 04/25/19.   They have not had any more periods since their implant.    3. Pertinent Review of Systems:  Constitutional: The patient feels "yeah". The patient seems healthy and active.  Eyes: Vision seems to be good. There are no recognized eye problems.  Neck: The patient has no complaints of anterior neck swelling, soreness, tenderness, pressure, discomfort, or difficulty swallowing.  Has scar on neck.  Heart: Heart rate increases with exercise or other physical activity. The patient has no complaints of palpitations, irregular heart beats, chest pain, or chest pressure.   Lungs: + asthma- but on controller medication.  Gastrointestinal: Bowel movents seem normal. The patient has no complaints of excessive hunger, upset stomach, stomache aches or pains, diarrhea, or constipation. Legs: Muscle mass and strength seem normal. There are no complaints of numbness, tingling, burning, or pain.  No edema is noted.  Feet: There are no obvious foot problems. There are no complaints of numbness, tingling, burning, or pain. No edema is noted. Neurologic: There are no recognized problems with muscle movement and strength, sensation, or coordination. GYN/GU: Supprelin 04/25/19. Else per HPI  PAST MEDICAL, FAMILY, AND SOCIAL HISTORY   Past Medical History:  Diagnosis Date  . ADHD (attention deficit hyperactivity disorder)   . Allergy    cats,  . Anxiety   . Asthma due to seasonal allergies   . Complication of anesthesia   . Cough variant asthma   . Depression   . Episodic mood disorder ([place])   . Family history of adverse reaction to anesthesia    mother was once combative and had PONV  . Family history of kidney cancer   . Family history of lung cancer   . Family history of uterine cancer   . Gastric ulcer   . GERD (gastroesophageal reflux disease)   . Hemorrhagic ovarian cyst    Left hemorrhagic ovarian cyst per mother  . History of Chiari malformation     s/p decompression, C1 laminectomy 09/30/2019 at [facility]  . Multinodular thyroid   . Obesity   . PONV (postoperative nausea and vomiting)   . PTSD (post-traumatic stress disorder)    per mother  . Recurrent upper respiratory infection (URI)     Family History  Problem Relation Age of Onset  . Diabetes Father   . Depression Father   . Heart disease Father        3 MI, triple bipass  . Hyperlipidemia Father   . Hypertension Father   . Learning disabilities Father        ADD/ADHD  . Mental illness Father        bipolar  . Vision loss Father        lens replacement surgery  . Allergic rhinitis Father   . Anxiety disorder Father   . Bipolar disorder Father   . ADD / ADHD Father   . Asthma Mother   . Arthritis Mother   . Depression Mother   . Allergic rhinitis Mother   . Urticaria Mother   . Thyroid cancer Mother 51       papillary  . Asthma Brother   . Learning disabilities Brother        disorder of written  expression  . Allergic rhinitis Brother   . ADD / ADHD Brother   . Kidney disease Maternal Grandmother   . Miscarriages / Stillbirths Maternal Grandmother   . Kidney cancer Maternal Grandmother 90       "transitional cell"  . Alcohol abuse Maternal Grandfather   . Mental illness Maternal Grandfather        Alzheimers, Vascular Damention  . Arthritis Paternal Grandmother   . COPD Paternal Grandmother   . COPD Paternal Grandfather   . Immunodeficiency Paternal Grandfather   . Drug abuse Maternal Uncle   . Early death Maternal Uncle   . Migraines Cousin   . Kidney cancer  Other        diagnosed in his 17s  . Lung cancer Maternal Uncle 56       rare subtype of small cell lung cancer  . Thyroid cancer Maternal Great-grandmother 82  . Uterine cancer Maternal Great-grandmother 56  . Birth defects Neg Hx   . Hearing loss Neg Hx   . Mental retardation Neg Hx   . Stroke Neg Hx   . Varicose Veins Neg Hx   . Angioedema Neg Hx   . Eczema Neg Hx   . Seizures Neg Hx   . Schizophrenia Neg Hx   . Autism Neg Hx      Current Outpatient Medications:  .  albuterol (VENTOLIN HFA) 108 (90 Base) MCG/ACT inhaler, Inhale 2 puffs into the lungs every 4 (four) hours as needed. (Patient taking differently: Inhale 2 puffs into the lungs every 4 (four) hours as needed for shortness of breath.), Disp: 18 g, Rfl: 1 .  Cholecalciferol 25 MCG (1000 UT) tablet, Take 2,000 Units by mouth at bedtime. , Disp: , Rfl:  .  cloNIDine HCl (KAPVAY) 0.1 MG TB12 ER tablet, Take 0.2 mg by mouth daily. , Disp: , Rfl: 1 .  Coenzyme Q10 (COQ10) 100 MG CAPS, Take 100 mg by mouth 2 (two) times daily. (Patient taking differently: Take 100 mg by mouth at bedtime.), Disp: 60 each, Rfl: 2 .  cyclobenzaprine (FLEXERIL) 10 MG tablet, Take 1 tablet (10 mg total) by mouth every 8 (eight) hours as needed for muscle spasms., Disp: 30 tablet, Rfl: 1 .  doxycycline (VIBRAMYCIN) 100 MG capsule, Take 1 capsule (100 mg total) by mouth 2 (two)  times daily., Disp: 20 capsule, Rfl: 0 .  fluticasone (FLOVENT HFA) 44 MCG/ACT inhaler, Inhale 2 puffs into the lungs 2 (two) times daily. (Patient taking differently: Inhale 2 puffs into the lungs 2 (two) times daily as needed (shortness of breath).), Disp: 1 Inhaler, Rfl: 5 .  ibuprofen (ADVIL) 600 MG tablet, Take 1 tablet (600 mg total) by mouth every 6 (six) hours as needed. (Patient taking differently: Take 600 mg by mouth every 6 (six) hours as needed for mild pain.), Disp: 90 tablet, Rfl: 1 .  KARBINAL ER 4 MG/5ML SUER, TAKE 7 AND 1/2 MLS TO 20 MLS ($Remov'6MG'QioDUg$  TO 16 MG) BY MOUTH 2 TIMES DAILY AS NEEDED. (Patient taking differently: Take 6-16 mg by mouth 2 (two) times daily as needed (allergies).), Disp: 480 mL, Rfl: 3 .  letrozole (FEMARA) 2.5 MG tablet, Take 1 tablet (2.5 mg total) by mouth daily., Disp: 30 tablet, Rfl: 6 .  levothyroxine (SYNTHROID) 150 MCG tablet, TAKE 1 TABLET (150 MCG TOTAL) BY MOUTH DAILY AT 6 (SIX) AM. (Patient taking differently: Take 150 mcg by mouth daily before breakfast.), Disp: 30 tablet, Rfl: 5 .  Multiple Vitamins-Minerals (MULTIVITAMIN GUMMIES ADULT PO), Take by mouth., Disp: , Rfl:  .  ondansetron (ZOFRAN ODT) 4 MG disintegrating tablet, Take 1 tablet (4 mg total) by mouth every 6 (six) hours as needed for nausea., Disp: 20 tablet, Rfl: 0 .  oxcarbazepine (TRILEPTAL) 600 MG tablet, Take 900 mg by mouth 2 (two) times daily. , Disp: , Rfl: 1 .  pantoprazole (PROTONIX) 40 MG tablet, Take 1 tablet (40 mg total) by mouth daily before breakfast. (Patient taking differently: Take 40 mg by mouth at bedtime.), Disp: 90 tablet, Rfl: 3 .  VYVANSE 70 MG capsule, Take 70 mg by mouth daily., Disp: , Rfl: 0 .  lidocaine-prilocaine (EMLA) cream, Use as  directed, Disp: 30 g, Rfl: 0 .  loperamide (IMODIUM) 2 MG capsule, Take 2 mg by mouth as needed for diarrhea or loose stools. (Patient not taking: Reported on 06/06/2020), Disp: , Rfl:  .  oxyCODONE (OXY IR/ROXICODONE) 5 MG immediate  release tablet, Take 1 tablet (5 mg total) by mouth every 6 (six) hours as needed for severe pain. (Patient not taking: Reported on 06/06/2020), Disp: 8 tablet, Rfl: 0 .  Testosterone 12.5 MG/ACT (1%) GEL, Apply one actuation to each underarm nightly., Disp: 75 g, Rfl: 1  Allergies as of 06/06/2020 - Review Complete 06/06/2020  Allergen Reaction Noted  . Other Other (See Comments) 10/22/2010  . Sulfa antibiotics Other (See Comments) 01/13/2017     reports that [name] "Heidi [name]" has never smoked. [name] "Heidi [name]" has never used smokeless tobacco. [name] "Heidi [name]" reports that [employer] "Heidi [name]" does not drink alcohol and does not use drugs. Pediatric History  Patient Parents  . Templer,Laura (Mother)   Other Topics Concern  . Not on file  Social History Narrative   Neaveh graduated high school in 01/2020. She lives with her parents. She enjoys playing with friends online, drawing, and YouTube. 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      504 in school.       Sees Dr. Darleene Cleaver       Identifies at [place]   Prefers pronouns- they/them   1. School and Family:  They have graduated high school  2. Activities: not active  3. Primary Care Provider: Donnamae Jude, MD  ROS: There are no other significant problems involving Brynnleigh's other body systems.    Objective:  Objective  Vital Signs:    BP 122/82 (BP Location: Right Arm, Patient Position: Sitting, Cuff Size: Normal)   Pulse 76   Ht 5' 4.96" (1.65 m)   Wt 291 lb 12.8 oz (132.4 kg)   LMP  (LMP Unknown)   BMI 48.62 kg/m     Ht Readings from Last 3 Encounters:  06/06/20 5' 4.96" (1.65 m) (61 %, Z= 0.27)*  05/25/20 $RemoveB'5\' 4"'JhaMyGjn$  (1.626 m) (46 %, Z= -0.11)*  05/22/20 $RemoveB'5\' 4"'WptUbWat$  (1.626 m) (46 %, Z= -0.11)*   * Growth percentiles are based on CDC (Girls, 2-20 Years) data.   Wt Readings from Last 3 Encounters:  06/06/20 291 lb 12.8 oz (132.4 kg) (>99 %, Z= 2.75)*  05/31/20 289 lb 14.4 oz (131.5 kg) (>99 %, Z= 2.73)*  05/25/20 289 lb  6.4 oz (131.3 kg) (>99 %, Z= 2.73)*   * Growth percentiles are based on CDC (Girls, 2-20 Years) data.   HC Readings from Last 3 Encounters:  No data found for [facility]   Body surface area is 2.46 meters squared. 61 %ile (Z= 0.27) based on CDC (Girls, 2-20 Years) Stature-for-age data based on Stature recorded on 06/06/2020. >99 %ile (Z= 2.75) based on CDC (Girls, 2-20 Years) weight-for-age data using vitals from 06/06/2020.   PHYSICAL EXAM:    Gen:  NAD. Comfortably talking with no agitation or distress. Weight is +5 pounds from last visit HEENT: moist mucous membranes.  Normal dentition. Thyroid Scar is healing well. No erythema.  Posterior laminectomy scar also healing well.  CV: normal pulses and distal perfusion PULM: normal work of breathing ABD: soft/nontender/nondistended/normal bowel sounds. Enlarged for age.  Umbilical herniation noted (new) EXT: No edema. Well healing scar from abscess on right thigh.  Neuro: Alert and oriented x3 Skin: +1 acanthosis on posterior neck. Posterior laminectomy scar- well  healed.   LAB DATA:     Lab Results  Component Value Date   HGBA1C  06/06/2020     Comment:     unable to run due to low hemoglobin   HGBA1C 5.9 (A) 03/05/2020   HGBA1C 5.7 (A) 11/29/2019   HGBA1C 5.9 (A) 01/11/2019   HGBA1C 5.7 (A) 09/06/2018   HGBA1C 5.7 (H) 03/19/2018   HGBA1C 5.4 03/06/2016    Results for orders placed or performed in visit on 06/06/20  POCT glycosylated hemoglobin (Hb A1C)  Result Value Ref Range   Hemoglobin A1C     HbA1c POC (<> result, manual entry)     HbA1c, POC (prediabetic range)     HbA1c, POC (controlled diabetic range)    POCT Glucose (Device for Home Use)  Result Value Ref Range   Glucose Fasting, POC     POC Glucose 112 (A) 70 - 99 mg/dl     Pelvic Ultrasound 11/02/18. IMPRESSION: 2.8 cm complex left ovarian cyst is noted which may represent hemorrhagic cyst. Short-interval follow up ultrasound in 6-12 weeks is recommended,  preferably during the week following the patient's normal menses.  Mild to moderate amount of free fluid is noted in the pelvis which may be physiologic or potentially represent ruptured ovarian cyst.  There is no evidence of ovarian torsion.    Pelvic Ultrasound 12/09/18 FINDINGS: Uterus Measurements: 6.9 x 2.5 x 3.8 cm = volume: 34.4 mL. No fibroids or other mass visualized.  Endometrium Thickness: 2.8 mm.  No focal abnormality visualized.  Right ovary Measurements: 4.2 x 1.9 x 1.9 cm = volume: 7.8 mL. Normal appearance/no adnexal mass.  Left ovary Measurements: 3.5 x 3.1 x 2.9 cm = volume: 16.4 mL. 1.8 x 1.6 x 2.1 cm simple cyst, most consistent with a normal physiologic follicular cyst/dominant follicle.  Other findings No abnormal free fluid.  IMPRESSION: Normal pelvic ultrasound.  Thyroid ultrasound 12/09/18 IMPRESSION: Abnormal appearance of the thyroid gland with both the right, and to a lesser extent, the left lobes of the thyroid replaced with predominantly colloid containing cysts. While none of the individual nodules/cysts meet imaging criteria to recommend percutaneous sampling or continued dedicated follow-up, given patient's young age and family history of thyroid cancer, potential imaging strategies could include proceeding with random ultrasound-guided fine-needle aspiration of the right lobe of the thyroid versus obtaining a 1 year follow-up thyroid ultrasound as clinically indicated.     Assessment and Plan:  Assessment  ASSESSMENT: Reilley is a 19 y.o. 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  They are gender non-binary and considering treatment options for being more androgenous. They have post surgical hypothyroidism.    Rapid weight gain - Weight has increased 5 pounds since last visit.  - Has made some good changes to sugar/food intake - Has been less active due to abscess after laproscopy - Still  working on exercise goals - dysphoria around breasts/breast size - Discussed compression wear/binders today - Discussed moderating portion size today  Acanthosis - They has posterior neck acanthosis as well as some at their antecubital fossae and knuckles and in their truncal folds - Stable  Thyroid/ post surgical hypothyroidism - Had multinodular/cystic thyroid on ultrasound - Now s/p thyroidectomy 01/26/19 - Thyroid levels improved on 150 mcg daily.  - clinical euthyroid -  Due for levels today  Menorrhagia/gender dysphoria - Supprelin implant placed 04/25/19 - significant dysphoria around breasts - Referral placed to Dr. Iran Planas in plastic surgery to discuss options for reduction/female  reconstruction. - but surgery denied by insurance. Appeal also denied - discussed starting testosterone and postponing implant removal until summer. Heidi [name] feels that it is still working (amenorrhea).  - Low dose transdermal testosterone prescription   PLAN:  1. Diagnostic:A1C, Puberty labs and TFTs today. Will also get CBC as unable to run clinic A1C today.  2. Therapeutic: Lifestyle changes with focus on exercise and liquid sugars. Referral to community therapy. Start transdermal testosterone with androgel 1 pump under both arms at bedtime.  3. Patient education: Lengthy discussion of above.  4. Follow-up: Return in about 3 months (around 09/04/2020).      Lelon Huh, MD   Level of Service:  >60 minutes spent today reviewing the medical chart, counseling the patient/family, and documenting today's encounter.  Patient referred by Leveda Anna, NP for concern for cushings, rapid weight gain  Copy of this note sent to Donnamae Jude, MD

## 2020-06-07 ENCOUNTER — Other Ambulatory Visit (INDEPENDENT_AMBULATORY_CARE_PROVIDER_SITE_OTHER): Payer: Self-pay | Admitting: Pediatric Endocrinology

## 2020-06-07 ENCOUNTER — Encounter (INDEPENDENT_AMBULATORY_CARE_PROVIDER_SITE_OTHER): Payer: Self-pay

## 2020-06-07 MED ORDER — LEVOTHYROXINE SODIUM 175 MCG PO TABS: 175.0000 ug | ORAL_TABLET | Freq: Every day | ORAL | 11 refills | Status: DC

## 2020-06-12 ENCOUNTER — Telehealth (INDEPENDENT_AMBULATORY_CARE_PROVIDER_SITE_OTHER): Payer: Self-pay

## 2020-06-12 LAB — HEMOGLOBIN A1C
Hgb A1c MFr Bld: 6 % of total Hgb — ABNORMAL HIGH (ref <5.7)
Mean Plasma Glucose: 126 mg/dL
eAG (mmol/L): 7 mmol/L

## 2020-06-12 LAB — COMPREHENSIVE METABOLIC PANEL
AG Ratio: 1.7 (calc) (ref 1.0–2.5)
ALT: 60 U/L — ABNORMAL HIGH (ref 5–32)
AST: 26 U/L (ref 12–32)
Albumin: 4.5 g/dL (ref 3.6–5.1)
Alkaline phosphatase (APISO): 99 U/L (ref 36–128)
BUN/Creatinine Ratio: 30 (calc) — ABNORMAL HIGH (ref 6–22)
BUN: 23 mg/dL — ABNORMAL HIGH (ref 7–20)
CO2: 26 mmol/L (ref 20–32)
Calcium: 9.6 mg/dL (ref 8.9–10.4)
Chloride: 104 mmol/L (ref 98–110)
Creat: 0.76 mg/dL (ref 0.50–1.00)
Globulin: 2.7 g/dL (calc) (ref 2.0–3.8)
Glucose, Bld: 95 mg/dL (ref 65–99)
Potassium: 4.5 mmol/L (ref 3.8–5.1)
Sodium: 139 mmol/L (ref 135–146)
Total Bilirubin: 0.5 mg/dL (ref 0.2–1.1)
Total Protein: 7.2 g/dL (ref 6.3–8.2)

## 2020-06-12 LAB — TESTOS,TOTAL,FREE AND SHBG (FEMALE)
Free Testosterone: 5.5 pg/mL (ref 0.1–6.4)
Sex Hormone Binding: 18 nmol/L (ref 17–124)
Testosterone, Total, LC-MS-MS: 31 ng/dL (ref 2–45)

## 2020-06-12 LAB — CBC WITH DIFFERENTIAL/PLATELET
Absolute Monocytes: 620 cells/uL (ref 200–950)
Basophils Absolute: 85 cells/uL (ref 0–200)
Basophils Relative: 0.9 %
Eosinophils Absolute: 122 cells/uL (ref 15–500)
Eosinophils Relative: 1.3 %
HCT: 42.1 % (ref 35.0–45.0)
Hemoglobin: 14.2 g/dL (ref 11.7–15.5)
Lymphs Abs: 3685 cells/uL (ref 850–3900)
MCH: 29.4 pg (ref 27.0–33.0)
MCHC: 33.7 g/dL (ref 32.0–36.0)
MCV: 87.2 fL (ref 80.0–100.0)
MPV: 10.8 fL (ref 7.5–12.5)
Monocytes Relative: 6.6 %
Neutro Abs: 4888 cells/uL (ref 1500–7800)
Neutrophils Relative %: 52 %
Platelets: 421 10*3/uL — ABNORMAL HIGH (ref 140–400)
RBC: 4.83 10*6/uL (ref 3.80–5.10)
RDW: 13.3 % (ref 11.0–15.0)
Total Lymphocyte: 39.2 %
WBC: 9.4 10*3/uL (ref 3.8–10.8)

## 2020-06-12 LAB — LIPID PANEL
Cholesterol: 255 mg/dL — ABNORMAL HIGH (ref <170)
HDL: 55 mg/dL (ref >45)
LDL Cholesterol (Calc): 161 mg/dL (calc) — ABNORMAL HIGH (ref <110)
Non-HDL Cholesterol (Calc): 200 mg/dL (calc) — ABNORMAL HIGH (ref <120)
Total CHOL/HDL Ratio: 4.6 (calc) (ref <5.0)
Triglycerides: 216 mg/dL — ABNORMAL HIGH (ref <90)

## 2020-06-12 LAB — ESTRADIOL, ULTRA SENS: Estradiol, Ultra Sensitive: 20 pg/mL

## 2020-06-12 LAB — FOLLICLE STIMULATING HORMONE: FSH: 2.5 m[IU]/mL

## 2020-06-12 LAB — T4, FREE: Free T4: 1 ng/dL (ref 0.8–1.4)

## 2020-06-12 LAB — TSH: TSH: 41.37 mIU/L — ABNORMAL HIGH

## 2020-06-12 LAB — LUTEINIZING HORMONE: LH: 0.3 m[IU]/mL — ABNORMAL LOW

## 2020-06-12 NOTE — Telephone Encounter (Addendum)
Received fax from pharmacy for PA started on covermymeds  Completed information for covermymeds  Key: [id number] -  PA Case ID: [id number] -  Rx #: [id number] 06/12/2020 - sent to plan 06/13/2020 - PA Case: [id number], Status: Denied. Notification: Completed.  Received fax stating they do not see certain details about use and treatment.  They may consider approval after trial and failure of Androgel Pump.

## 2020-06-13 NOTE — Telephone Encounter (Signed)
Called pharmacy to check which the insurance will accept.  They did a test run for generic and brand Androgel pump, both required a PA. Called [employer] & was transferred to the pharmacy PA team, per [facility] Name is covered.  She sees where the pharmacy attempted to run both the generic and brand.  She is unsure what the rejection exactly was for the brand but that is the preferred medication.  She recommended that the pharmacy call their help desk at [phone] if they can not run it.

## 2020-06-18 ENCOUNTER — Encounter: Payer: Self-pay | Admitting: Family Medicine

## 2020-06-18 ENCOUNTER — Ambulatory Visit (INDEPENDENT_AMBULATORY_CARE_PROVIDER_SITE_OTHER): Payer: Medicaid Other | Admitting: Family Medicine

## 2020-06-18 ENCOUNTER — Other Ambulatory Visit: Payer: Self-pay

## 2020-06-18 VITALS — BP 107/76 | HR 80 | Wt 294.0 lb

## 2020-06-18 DIAGNOSIS — R102 Pelvic and perineal pain: Secondary | ICD-10-CM | POA: Diagnosis not present

## 2020-06-18 DIAGNOSIS — Z5189 Encounter for other specified aftercare: Secondary | ICD-10-CM

## 2020-06-18 NOTE — Assessment & Plan Note (Signed)
Working with PT to continue to improve this.

## 2020-06-18 NOTE — Progress Notes (Signed)
   Subjective:    Patient ID: Heidi [name] is a 20 y.o. adult presenting with Follow-up  on 06/18/2020  HPI: They are s/p laparoscopy for pelvic pain. They had an abscess. This has improved. Pelvic PT helping slightly, still in therapy  Review of Systems  Constitutional: Negative for chills and fever.  Respiratory: Negative for shortness of breath.   Cardiovascular: Negative for chest pain.  Gastrointestinal: Negative for abdominal pain, nausea and vomiting.  Genitourinary: Negative for dysuria.  Skin: Negative for rash.      Objective:    BP 107/76   Pulse 80   Wt 294 lb (133.4 kg)   LMP  (LMP Unknown)   BMI 48.98 kg/m  Physical Exam Vitals reviewed.  Constitutional:      General: Heidi Prose "[name]" is not in acute distress.    Appearance: Heidi Guest "[name]" is well-developed and well-nourished.  HENT:     Head: Normocephalic and atraumatic.  Eyes:     General: No scleral icterus.    Conjunctiva/sclera: Conjunctivae normal.  Neck:     Thyroid: No thyromegaly.  Cardiovascular:     Rate and Rhythm: Normal rate and regular rhythm.     Heart sounds: No murmur heard.   Pulmonary:     Effort: Pulmonary effort is normal.     Breath sounds: Normal breath sounds. No wheezing.  Abdominal:     Palpations: Abdomen is soft. There is no mass.     Tenderness: There is no abdominal tenderness.     Comments: Small fleshy part of umbilicus noted, it is firm, not c/w hernia.  Musculoskeletal:        General: No edema. Normal range of motion.     Cervical back: Neck supple.  Skin:    General: Skin is warm and dry.     Findings: No rash.  Neurological:     Mental Status: Heidi Kercheval "[name]" is alert and oriented to person, place, and time.  Psychiatric:        Mood and Affect: Mood and affect normal.        Behavior: Behavior normal.         Assessment & Plan:   Problem List Items Addressed This Visit      Unprioritized   Pelvic pain - Primary    Working with PT to  continue to improve this.       Other Visit Diagnoses    Visit for wound check       suspect this will continue to shrink as we get further removed from surgery suspect that there is suture in there that will take about 90 days to fully resolve      Total time in review of prior notes, pathology, labs, history taking, review with patient, exam, note writing, discussion of options, plan for next steps, alternatives and risks of treatment: 14 minutes.  Return in about 4 weeks (around 07/16/2020) for in person, a follow-up.  Heidi [name] 06/18/2020 10:23 AM

## 2020-06-21 ENCOUNTER — Other Ambulatory Visit: Payer: Self-pay

## 2020-06-21 ENCOUNTER — Encounter: Payer: Self-pay | Admitting: Physical Therapy

## 2020-06-21 ENCOUNTER — Ambulatory Visit: Payer: Medicaid Other | Attending: Urology | Admitting: Physical Therapy

## 2020-06-21 DIAGNOSIS — M545 Low back pain, unspecified: Secondary | ICD-10-CM | POA: Diagnosis not present

## 2020-06-21 DIAGNOSIS — M6281 Muscle weakness (generalized): Secondary | ICD-10-CM

## 2020-06-21 DIAGNOSIS — R278 Other lack of coordination: Secondary | ICD-10-CM | POA: Diagnosis not present

## 2020-06-21 DIAGNOSIS — R252 Cramp and spasm: Secondary | ICD-10-CM | POA: Diagnosis not present
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
## 2020-06-21 NOTE — Patient Instructions (Addendum)
   Self treatment for pelvic pain  Connect PT on You tube  Focus on massaging the inner thigh. 10 strokes per thigh  [facility] [address] [place], [place] [address] Phone # [phone] Fax [phone]

## 2020-06-28 ENCOUNTER — Encounter: Payer: Self-pay | Admitting: Physical Therapy

## 2020-06-28 ENCOUNTER — Other Ambulatory Visit: Payer: Self-pay

## 2020-06-28 ENCOUNTER — Ambulatory Visit: Payer: Medicaid Other | Admitting: Physical Therapy

## 2020-06-28 DIAGNOSIS — R278 Other lack of coordination: Secondary | ICD-10-CM | POA: Diagnosis not present

## 2020-06-28 DIAGNOSIS — M545 Low back pain, unspecified: Secondary | ICD-10-CM | POA: Diagnosis not present

## 2020-06-28 DIAGNOSIS — M6281 Muscle weakness (generalized): Secondary | ICD-10-CM

## 2020-06-28 DIAGNOSIS — R252 Cramp and spasm: Secondary | ICD-10-CM
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
## 2020-07-04 ENCOUNTER — Other Ambulatory Visit: Payer: Self-pay

## 2020-07-04 ENCOUNTER — Encounter: Payer: Self-pay | Admitting: Pediatrics

## 2020-07-04 ENCOUNTER — Ambulatory Visit (INDEPENDENT_AMBULATORY_CARE_PROVIDER_SITE_OTHER): Payer: Medicaid Other | Admitting: Pediatrics

## 2020-07-04 VITALS — Wt 289.3 lb

## 2020-07-04 DIAGNOSIS — J329 Chronic sinusitis, unspecified: Secondary | ICD-10-CM

## 2020-07-04 DIAGNOSIS — B9789 Other viral agents as the cause of diseases classified elsewhere: Secondary | ICD-10-CM | POA: Diagnosis not present

## 2020-07-04 DIAGNOSIS — L03317 Cellulitis of buttock: Secondary | ICD-10-CM | POA: Diagnosis not present

## 2020-07-04 MED ORDER — CEPHALEXIN 500 MG PO CAPS: 500.0000 mg | ORAL_CAPSULE | Freq: Two times a day (BID) | ORAL | 0 refills | Status: AC

## 2020-07-04 MED ORDER — MUPIROCIN 2 % EX OINT: 1.0000 "application " | TOPICAL_OINTMENT | Freq: Two times a day (BID) | CUTANEOUS | 0 refills | Status: AC

## 2020-07-04 NOTE — Progress Notes (Signed)
Subjective:     Heidi [name] is a 20 y.o. adult who presents for evaluation of symptoms of a URI. Symptoms include congestion, cough described as productive, fever 101.27F 2 days ago, post nasal drip and sneezing. Onset of symptoms was 3 days ago, and has been stable since that time. Treatment to date: OTC mult-symptom medication.  She is also here for evaluation of lump on the top of the left buttock, near the crease. The lump is hot to the touch, dense, and tender. The lump feels like the abscess she had previously on her thigh. She noticed the lump about 1 weeks ago. She has not noticed any discharge or drainage.  The following portions of the patient's history were reviewed and updated as appropriate: allergies, current medications, past family history, past medical history, past social history, past surgical history and problem list.  Review of Systems Pertinent items are noted in HPI.   Objective:    Wt 289 lb 5 oz (131.2 kg)   BMI 48.20 kg/m  General appearance: alert, cooperative, appears stated age and no distress Head: Normocephalic, without obvious abnormality, atraumatic Eyes: conjunctivae/corneas clear. PERRL, EOM's intact. Fundi benign. Ears: normal TM's and external ear canals both ears Nose: Nares normal. Septum midline. Mucosa normal. No drainage or sinus tenderness., mild congestion Throat: lips, mucosa, and tongue normal; teeth and gums normal Neck: no adenopathy, no carotid bruit, no JVD, supple, symmetrical, trachea midline and thyroid not enlarged, symmetric, no tenderness/mass/nodules Lungs: clear to auscultation bilaterally Heart: regular rate and rhythm, S1, S2 normal, no murmur, click, rub or gallop Skin: dense, slightly raised erythematous area at the top of the left buttock at the crease   Assessment:    Cellulitis of the left buttock Viral sinusitis  Plan:    Discussed diagnosis and treatment of URI. Suggested symptomatic OTC remedies. Nasal saline spray  for congestion.   Keflex per orders Mupirocin ointment per orders Written instructions given to patient for care of the cellulitis If no improvement after 4 days of antibiotics, will refer to surgery for evaluation of I&D Follow up as needed

## 2020-07-04 NOTE — Patient Instructions (Signed)
Keflex (Cephalexin) 1 capsul 2 times a day for 10 days If no improvement after 4 days of antibiotic, call or send MyChart message and will refer to surgery for possible drainage Mupirocin ointment- apply to left buttock area 2 times a day until sore has resolved Wash area on buttock with gold Dial antibacterial soap daily For sinus symptoms: Over the counter nasal decongestant as needed If poorly tolerated, can just increase fluids Regardless of medication, make sure you are drinking plenty of water Humidifier at bedtime Nasal saline spray as needed Follow up as needed

## 2020-07-05 ENCOUNTER — Encounter: Payer: Medicaid Other | Admitting: Physical Therapy

## 2020-07-17 ENCOUNTER — Encounter: Payer: Medicaid Other | Admitting: Physical Therapy

## 2020-07-19 ENCOUNTER — Encounter: Payer: Self-pay | Admitting: Family Medicine

## 2020-07-19 ENCOUNTER — Ambulatory Visit (INDEPENDENT_AMBULATORY_CARE_PROVIDER_SITE_OTHER): Payer: Medicaid Other | Admitting: Family Medicine

## 2020-07-19 ENCOUNTER — Other Ambulatory Visit: Payer: Self-pay

## 2020-07-19 VITALS — BP 112/71 | HR 91 | Ht 64.0 in | Wt 292.0 lb

## 2020-07-19 DIAGNOSIS — Z5189 Encounter for other specified aftercare: Secondary | ICD-10-CM | POA: Diagnosis not present

## 2020-07-19 DIAGNOSIS — R102 Pelvic and perineal pain: Secondary | ICD-10-CM | POA: Diagnosis not present

## 2020-07-19 NOTE — Progress Notes (Signed)
   Subjective:    Patient ID: Heidi [name] is a 20 y.o. adult presenting with Wound Check  on 07/19/2020  HPI: Still having some leaking. The redness has improved. Feels like their pelvic pain is slowly but surely getting better with pelvic PT.  Review of Systems  Constitutional: Negative for chills and fever.  Respiratory: Negative for shortness of breath.   Cardiovascular: Negative for chest pain.  Gastrointestinal: Negative for abdominal pain, nausea and vomiting.  Genitourinary: Negative for dysuria.  Skin: Negative for rash.      Objective:    BP 112/71   Pulse 91   Ht 5\' 4"  (1.626 m)   Wt 292 lb (132.5 kg)   BMI 50.12 kg/m  Physical Exam Constitutional:      General: Heidi Rozas "[name]" is not in acute distress.    Appearance: Heidi Cocco "[name]" is well-developed and well-nourished.  HENT:     Head: Normocephalic and atraumatic.  Eyes:     General: No scleral icterus. Cardiovascular:     Rate and Rhythm: Normal rate.  Pulmonary:     Effort: Pulmonary effort is normal.  Abdominal:     Palpations: Abdomen is soft.     Comments: Umbilical incision is healing well, without erythema. Slight open areas, treated with AgNO3.  Musculoskeletal:     Cervical back: Neck supple.  Skin:    General: Skin is warm and dry.  Neurological:     Mental Status: Heidi Tate "[name]" is alert and oriented to person, place, and time.  Psychiatric:        Mood and Affect: Mood and affect normal.         Assessment & Plan:   Problem List Items Addressed This Visit      Unprioritized   Pelvic pain    Continue PT which is helping slowly.       Other Visit Diagnoses    Visit for wound check    -  Primary   continue H2O2 bid-tid. Usual course reviewed      Total time in review of prior notes, pathology, labs, history taking, review with patient, exam, note writing, discussion of options, plan for next steps, alternatives and risks of treatment: 22 minutes.  Return if symptoms  worsen or fail to improve.  Heidi [name] 07/20/2020 7:08 AM

## 2020-07-20 NOTE — Assessment & Plan Note (Signed)
Continue PT which is helping slowly.

## 2020-07-24 ENCOUNTER — Other Ambulatory Visit: Payer: Self-pay

## 2020-07-24 ENCOUNTER — Encounter: Payer: Self-pay | Admitting: Physical Therapy

## 2020-07-24 ENCOUNTER — Encounter: Payer: Medicaid Other | Attending: Urology | Admitting: Physical Therapy

## 2020-07-24 DIAGNOSIS — M6281 Muscle weakness (generalized): Secondary | ICD-10-CM

## 2020-07-24 DIAGNOSIS — R278 Other lack of coordination: Secondary | ICD-10-CM

## 2020-07-24 DIAGNOSIS — R252 Cramp and spasm: Secondary | ICD-10-CM

## 2020-07-24 DIAGNOSIS — M545 Low back pain, unspecified: Secondary | ICD-10-CM | POA: Insufficient documentation
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
## 2020-07-24 NOTE — Patient Instructions (Signed)
Access Code: [email or web address] URL: [email or web address] Date: 07/24/2020 Prepared by: Earlie Counts  Program Notes walking for 5 minutes 3 time per day   Exercises Supine Butterfly Groin Stretch - 1 x daily - 7 x weekly - 1 sets - 1 reps - 30-60 sec hold Supine Lower Trunk Rotation - 1 x daily - 7 x weekly - 1 sets - 2 reps - 30 sec hold Reclined Diaphragmatic Breathing - 2 x daily - 7 x weekly - 1 sets - 10 reps - 5 sec hold Long Sitting Hamstring Stretch - 1 x daily - 7 x weekly - 1 sets - 2 reps - 30 sec hold Gastroc Stretch on Wall - 1 x daily - 7 x weekly - 1 sets - 2 reps - 30 sec hold [facility] [address], [place], [place] [address] Phone # [phone] Fax [phone]

## 2020-08-03 ENCOUNTER — Ambulatory Visit: Payer: BLUE CROSS/BLUE SHIELD | Admitting: Pediatrics

## 2020-08-03 DIAGNOSIS — Z00129 Encounter for routine child health examination without abnormal findings: Secondary | ICD-10-CM

## 2020-08-07 ENCOUNTER — Encounter: Payer: Medicaid Other | Admitting: Physical Therapy

## 2020-08-07 ENCOUNTER — Encounter: Payer: Self-pay | Admitting: Physical Therapy

## 2020-08-07 ENCOUNTER — Other Ambulatory Visit: Payer: Self-pay

## 2020-08-07 DIAGNOSIS — M6281 Muscle weakness (generalized): Secondary | ICD-10-CM | POA: Diagnosis not present

## 2020-08-07 DIAGNOSIS — R252 Cramp and spasm: Secondary | ICD-10-CM

## 2020-08-07 DIAGNOSIS — M545 Low back pain, unspecified: Secondary | ICD-10-CM

## 2020-08-07 DIAGNOSIS — R278 Other lack of coordination: Secondary | ICD-10-CM | POA: Diagnosis not present
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
## 2020-08-07 NOTE — Patient Instructions (Addendum)
Sitting in the shower Press then circular massage to improve blood flow Do it on the inside of the labia Do this for 3-4 minutes Do 1 time per week  Use a rolling pin to massage the inner thighs     [email or web address] Earlie Counts, PT [facility] [address] [place], [place] [address] W: [phone] [email or web address]

## 2020-08-08 ENCOUNTER — Telehealth: Payer: Self-pay

## 2020-08-08 NOTE — Telephone Encounter (Signed)
Mother called and told us that the reason for a the no show was that it was not written on moms calendar. She apologized no show policy was explained and understood.

## 2020-08-09 ENCOUNTER — Encounter: Payer: Medicaid Other | Admitting: Physical Therapy

## 2020-08-16 ENCOUNTER — Encounter: Payer: Medicaid Other | Admitting: Physical Therapy

## 2020-08-31 ENCOUNTER — Other Ambulatory Visit: Payer: Self-pay

## 2020-08-31 ENCOUNTER — Encounter: Payer: Self-pay | Admitting: Pediatrics

## 2020-08-31 ENCOUNTER — Ambulatory Visit (INDEPENDENT_AMBULATORY_CARE_PROVIDER_SITE_OTHER): Payer: Medicaid Other | Admitting: Pediatrics

## 2020-08-31 VITALS — BP 118/76 | Ht 62.75 in | Wt 295.8 lb

## 2020-08-31 DIAGNOSIS — Z23 Encounter for immunization: Secondary | ICD-10-CM | POA: Diagnosis not present

## 2020-08-31 DIAGNOSIS — Z0000 Encounter for general adult medical examination without abnormal findings: Secondary | ICD-10-CM | POA: Diagnosis not present

## 2020-08-31 DIAGNOSIS — Z00129 Encounter for routine child health examination without abnormal findings: Secondary | ICD-10-CM

## 2020-08-31 DIAGNOSIS — Z68.41 Body mass index (BMI) pediatric, greater than or equal to 95th percentile for age: Secondary | ICD-10-CM | POA: Diagnosis not present

## 2020-08-31 NOTE — Patient Instructions (Signed)
Preventive Care 18-21 Years Old, Female Preventive care refers to lifestyle choices and visits with your health care provider that can promote health and wellness. At this stage in your life, you may start seeing a primary care physician instead of a pediatrician. It is important to take responsibility for your health and well-being. Preventive care for young adults includes:  A yearly physical exam. This is also called an annual wellness visit.  Regular dental and eye exams.  Immunizations.  Screening for certain conditions.  Healthy lifestyle choices, such as: ? Eating a healthy diet. ? Getting regular exercise. ? Not using drugs or products that contain nicotine and tobacco. ? Limiting alcohol use. What can I expect for my preventive care visit? Physical exam Your health care provider may check your:  Height and weight. These may be used to calculate your BMI (body mass index). BMI is a measurement that tells if you are at a healthy weight.  Heart rate and blood pressure.  Body temperature.  Skin for abnormal spots. Counseling Your health care provider may ask you questions about your:  Past medical problems.  Family's medical history.  Alcohol, tobacco, and drug use.  Home life and relationship well-being.  Access to firearms.  Emotional well-being.  Diet, exercise, and sleep habits.  Sexual activity and sexual health.  Method of birth control.  Menstrual cycle.  Pregnancy history. What immunizations do I need? Vaccines are usually given at various ages, according to a schedule. Your health care provider will recommend vaccines for you based on your age, medical history, and lifestyle or other factors, such as travel or where you work.   What tests do I need? Blood tests  Lipid and cholesterol levels. These may be checked every 5 years starting at age 20.  Hepatitis C test.  Hepatitis B test. Screening  Pelvic exam and Pap test. This may be done  every 3 years starting at age 21.  STD (sexually transmitted disease) testing, if you are at risk.  BRCA-related cancer screening. This may be done if you have a family history of breast, ovarian, tubal, or peritoneal cancers. Other tests  Tuberculosis skin test.  Vision and hearing tests.  Skin exam.  Breast exam. Talk with your health care provider about your test results, treatment options, and if necessary, the need for more tests. Follow these instructions at home: Eating and drinking  Eat a healthy diet that includes fresh fruits and vegetables, whole grains, lean protein, and low-fat dairy products.  Drink enough fluid to keep your urine pale yellow.  Do not drink alcohol if: ? Your health care provider tells you not to drink. ? You are pregnant, may be pregnant, or are planning to become pregnant. ? You are under the legal drinking age. In the [place] the legal drinking age is 21.  If you drink alcohol: ? Limit how much you use to 0-1 drink a day. ? Be aware of how much alcohol is in your drink. In the [place] one drink equals one 12 oz bottle of beer (355 mL), one 5 oz glass of wine (148 mL), or one 1 oz glass of hard liquor (44 mL).   Lifestyle  Take daily care of your teeth and gums. Brush your teeth every morning and night with fluoride toothpaste. Floss one time each day.  Stay active. Exercise for at least 30 minutes 5 or more days of the week.  Do not use any products that contain nicotine or tobacco, such as cigarettes,   e-cigarettes, and chewing tobacco. If you need help quitting, ask your health care provider.  Do not use drugs.  If you are sexually active, practice safe sex. Use a condom or other form of protection to prevent STIs (sexually transmitted infections).  If you do not wish to become pregnant, use a form of birth control. If you plan to become pregnant, see your health care provider for a prepregnancy visit.  Find healthy ways to cope with stress,  such as: ? Meditation, yoga, or listening to music. ? Journaling. ? Talking to a trusted person. ? Spending time with friends and family. Safety  Always wear your seat belt while driving or riding in a vehicle.  Do not drive: ? If you have been drinking alcohol. Do not ride with someone who has been drinking. ? When you are tired or distracted. ? While texting.  Wear a helmet and other protective equipment during sports activities.  If you have firearms in your house, make sure you follow all gun safety procedures.  Seek help if you have been bullied, physically abused, or sexually abused.  Use the Internet responsibly to avoid dangers, such as online bullying and online sex predators. What's next?  Go to your health care provider once a year for an annual wellness visit.  Ask your health care provider how often you should have your eyes and teeth checked.  Stay up to date on all vaccines. This information is not intended to replace advice given to you by your health care provider. Make sure you discuss any questions you have with your health care provider. Document Revised: 01/29/2020 Document Reviewed: 05/27/2018 Elsevier Patient Education  2021 Elsevier Inc.  

## 2020-08-31 NOTE — Progress Notes (Signed)
Subjective:     Heidi [name] is a 20 y.o. adult and is here for a comprehensive physical exam. The patient reports problems - father is not accepting of non-binary identity, cyst on right buttock.  Social History   Socioeconomic History  . Marital status: Significant Other    Spouse name: Not on file  . Number of children: Not on file  . Years of education: Not on file  . Highest education level: Not on file  Occupational History  . Not on file  Tobacco Use  . Smoking status: Never Smoker  . Smokeless tobacco: Never Used  Vaping Use  . Vaping Use: Never used  Substance and Sexual Activity  . Alcohol use: No  . Drug use: No  . Sexual activity: Never  Other Topics Concern  . Not on file  Social History Narrative   Heidi [name] graduated high school in 01/2020. She lives with her parents. She enjoys playing with friends online, drawing, and YouTube. 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      504 in school.       Sees Dr. Darleene [name]       Identifies at [place]   Prefers pronouns- they/them   Social Determinants of Health   Financial Resource Strain: Not on file  Food Insecurity: Food Insecurity Present  . Worried About Charity fundraiser in the Last Year: Sometimes true  . Ran Out of Food in the Last Year: Sometimes true  Transportation Needs: No Transportation Needs  . Lack of Transportation (Medical): No  . Lack of Transportation (Non-Medical): No  Physical Activity: Not on file  Stress: Not on file  Social Connections: Not on file  Intimate Partner Violence: Not on file   Health Maintenance  Topic Date Due  . Hepatitis C Screening  Never done  . COVID-19 Vaccine (1) Never done  . TETANUS/TDAP  12/24/2022  . HPV VACCINES  Completed  . HIV Screening  Completed  . INFLUENZA VACCINE  Discontinued    The following portions of the patient's history were reviewed and updated as appropriate: allergies, current medications, past family history, past medical history,  past social history, past surgical history and problem list.  Review of Systems Pertinent items are noted in HPI.   Objective:    BP 118/76   Ht 5' 2.75" (1.594 m)   Wt 295 lb 12.8 oz (134.2 kg)   BMI 52.82 kg/m  General appearance: alert, cooperative, appears stated age and no distress Head: Normocephalic, without obvious abnormality, atraumatic Eyes: conjunctivae/corneas clear. PERRL, EOM's intact. Fundi benign. Ears: normal TM's and external ear canals both ears Nose: Nares normal. Septum midline. Mucosa normal. No drainage or sinus tenderness. Throat: lips, mucosa, and tongue normal; teeth and gums normal Neck: no adenopathy, no carotid bruit, no JVD, supple, symmetrical, trachea midline and thyroid not enlarged, symmetric, no tenderness/mass/nodules Lungs: clear to auscultation bilaterally Heart: regular rate and rhythm, S1, S2 normal, no murmur, click, rub or gallop and normal apical impulse Abdomen: soft, non-tender; bowel sounds normal; no masses,  no organomegaly Extremities: extremities normal, atraumatic, no cyanosis or edema Skin: Skin color, texture, turgor normal. No rashes or lesions Neurologic: Alert and oriented X 3, normal strength and tone. Normal symmetric reflexes. Normal coordination and gait    Assessment:    Healthy adult exam.  Obese- BMI 99%     Plan:    MenB vaccine per orders. Indications, contraindications and side effects of vaccine/vaccines discussed with parent and parent verbally  expressed understanding and also agreed with the administration of vaccine/vaccines as ordered above today.Handout (VIS) given for each vaccine at this visit. Elevated PHQ-9 score. Recommended counseling but they are not interested in counseling at this time. See After Visit Summary for Counseling Recommendations

## 2020-09-06 ENCOUNTER — Ambulatory Visit (INDEPENDENT_AMBULATORY_CARE_PROVIDER_SITE_OTHER): Payer: Medicaid Other | Admitting: Pediatric Endocrinology

## 2020-09-06 ENCOUNTER — Other Ambulatory Visit: Payer: Self-pay

## 2020-09-06 ENCOUNTER — Encounter (INDEPENDENT_AMBULATORY_CARE_PROVIDER_SITE_OTHER): Payer: Self-pay | Admitting: Pediatric Endocrinology

## 2020-09-06 VITALS — BP 126/80 | Wt 301.4 lb

## 2020-09-06 DIAGNOSIS — F640 Transsexualism: Secondary | ICD-10-CM | POA: Diagnosis not present

## 2020-09-06 DIAGNOSIS — E890 Postprocedural hypothyroidism: Secondary | ICD-10-CM | POA: Diagnosis not present

## 2020-09-06 DIAGNOSIS — L83 Acanthosis nigricans: Secondary | ICD-10-CM | POA: Diagnosis not present

## 2020-09-06 DIAGNOSIS — L03114 Cellulitis of left upper limb: Secondary | ICD-10-CM

## 2020-09-06 DIAGNOSIS — R7309 Other abnormal glucose: Secondary | ICD-10-CM

## 2020-09-06 MED ORDER — TESTOSTERONE 20.25 MG/ACT (1.62%) TD GEL: 1.0000 | Freq: Every day | TRANSDERMAL | 1 refills | Status: AC

## 2020-09-06 MED ORDER — METFORMIN HCL ER 500 MG PO TB24: 1000.0000 mg | ORAL_TABLET | Freq: Every day | ORAL | 11 refills | Status: DC

## 2020-09-06 NOTE — Patient Instructions (Addendum)
Wrote for a stronger version of the testosterone androgel. You can start with 1 pump EVERY OTHER DAY.   Lab orders are in for thyroid and pre diabetes labs.   Start Metformin 1 pill with dinner. Increase to 2 pills in 1-2 weeks.    Binder giveaways: [employer] Free Youth Binder Program: (https://www.ftmessentials.com/pages/ftme-free-youth-binder-program) Under 20yo who cannot afford a binder.  Application required [email or web address] Free Chest Binder Donation Program: ([employer]si) free binders for all ages who cannot afford binder.  Application required.

## 2020-09-06 NOTE — Progress Notes (Signed)
Subjective:  Subjective  Patient Name: Heidi [name] Date of Birth: 11-03-2000  MRN: [id number]  Heidi [name]  Presents to our office today for follow up evaluation and management of Heidi [name] "Heidi [name]"'s weight gain, abnormal menses. They has post surgical hypothyroidism. (thyroidectomy for multinodular goiter with fm hx PTC)   HISTORY OF PRESENT ILLNESS:   Heidi [name] is a 20 y.o. Caucasian female   [name] was accompanied by their mom   1. Rosangela was seen by their adolescent medicine specialist and their neurologist in the fall of 2019. They was having issues with weight gain, dysmenorrhea, lipid abnormalities, and migraines. 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    2 Heidi [name] was last seen in pediatric endocrine clinic on 06/06/20. In the interim they have been doing ok.   Heidi [name] says "I am not ok but hopefully I will be soon". They say that they have had increase in anxiety and depression. Mom does not think that Heidi [name]'s Psychiatrist knows that they have had an increase in symptoms. Heidi [name] thinks that they did tell him at their last phone visit (3/7). They are in the process of being evaluated for ASD, Anxiety, Dissociative disorder.   Their pelvic pain has improved. They are no longer doing pelvic PT. They are complaining that their back pain and knee pain have increased.   Heidi [name] got their second Meningococcal immunization on 3/18. They have a large red, bruised lesion on their arm where it was injected. They have not informed their PCP of the reaction. They have not done anything with it   ------------- Mom is having symptomatic PVCs. Dad had a 3rd stroke and had above the knee amputations.    Thyroid  After last visit we increased their thyroid medication dose to 175 mcg of Levothyroxine daily. They do not feel any different on the increased dose.  TSH was elevated to 41.7 on their labs in December.   They sometimes forget to take it in the morning. They have not been doubling  their dose.   Sleep is good. Energy level is good.    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 Implant was placed by Dr. Windy Canny on 04/25/19. Family feels that it is still working very well. They have not had any cramps or PMS or other signs of menstruation.   Topical testosterone ordered at last visit was denied by insurance. Will retry today.   Dysphoria remains due to breasts. They were denied for a reduction by insurance (mom talked them down from getting full female reconstruction).   Pre Diabetes  They feel frustrated that they stopped exercising due to leg pain and lost the progress that they felt that they had been making.   Appetite has been "normal".  They have been drinking "plenty of water" and some zero sugar sodas. They are drinking even fewer sugar drinks. They do have a sweet drink on Saturdays.   They have continued to eat at [facility]. They are putting more protein and fewer condiments. 6" instead of 12". Avoiding the cookies.      ______  They had Chairi repair - decompression and laminectomy of C1. Since then the episodic migraines have resolved.  Heidi [name] is so happy to no longer have headaches.   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   S/P SOC for decompression, C1 laminectomy, duraplasty (09/30/2019). Done by Dr Camila Li at [facility]  They had their thyroidectomy  on 01/26/19 with Dr. Celine Ahr.  They had a Supprelin implant 04/25/19.   They have not had any more periods since their implant.    3. Pertinent Review of Systems:  Constitutional: The patient feels "tired today". The patient seems healthy and active.  Eyes: Vision seems to be good. There are no recognized eye problems.  Neck: The patient has no complaints of anterior neck swelling, soreness, tenderness, pressure, discomfort, or difficulty swallowing.  Has scar on neck.  Heart: Heart rate increases with exercise or  other physical activity. The patient has no complaints of palpitations, irregular heart beats, chest pain, or chest pressure.   Lungs: + asthma- but on controller medication.  Gastrointestinal: Bowel movents seem normal. The patient has no complaints of excessive hunger, upset stomach, stomache aches or pains, diarrhea, or constipation. Legs: Muscle mass and strength seem normal. There are no complaints of numbness, tingling, burning, or pain. No edema is noted.  Feet: There are no obvious foot problems. There are no complaints of numbness, tingling, burning, or pain. No edema is noted. Neurologic: There are no recognized problems with muscle movement and strength, sensation, or coordination. GYN/GU: Supprelin 04/25/19. Else per HPI  PAST MEDICAL, FAMILY, AND SOCIAL HISTORY   Past Medical History:  Diagnosis Date  . ADHD (attention deficit hyperactivity disorder)   . Allergy    cats,  . Anxiety   . Asthma due to seasonal allergies   . Complication of anesthesia   . Cough variant asthma   . Depression   . Episodic mood disorder ([place])   . Family history of adverse reaction to anesthesia    mother was once combative and had PONV  . Family history of kidney cancer   . Family history of lung cancer   . Family history of uterine cancer   . Gastric ulcer   . GERD (gastroesophageal reflux disease)   . Hemorrhagic ovarian cyst    Left hemorrhagic ovarian cyst per mother  . History of Chiari malformation     s/p decompression, C1 laminectomy 09/30/2019 at [facility]  . Multinodular thyroid   . Obesity   . PONV (postoperative nausea and vomiting)   . PTSD (post-traumatic stress disorder)    per mother  . Recurrent upper respiratory infection (URI)     Family History  Problem Relation Age of Onset  . Diabetes Father   . Depression Father   . Heart disease Father        3 MI, triple bipass  . Hyperlipidemia Father   . Hypertension Father   . Learning disabilities Father        ADD/ADHD  .  Mental illness Father        bipolar  . Vision loss Father        lens replacement surgery  . Allergic rhinitis Father   . Anxiety disorder Father   . Bipolar disorder Father   . ADD / ADHD Father   . Asthma Mother   . Arthritis Mother   . Depression Mother   . Allergic rhinitis Mother   . Urticaria Mother   . Thyroid cancer Mother 12       papillary  . Asthma Brother   . Learning disabilities Brother        disorder of written expression  . Allergic rhinitis Brother   . ADD / ADHD Brother   . Kidney disease Maternal Grandmother   . Miscarriages / Stillbirths Maternal Grandmother   . Kidney cancer Maternal Grandmother 31       "  transitional cell"  . Alcohol abuse Maternal Grandfather   . Mental illness Maternal Grandfather        Alzheimers, Vascular Damention  . Arthritis Paternal Grandmother   . COPD Paternal Grandmother   . COPD Paternal Grandfather   . Immunodeficiency Paternal Grandfather   . Drug abuse Maternal Uncle   . Early death Maternal Uncle   . Migraines Cousin   . Kidney cancer Other        diagnosed in his 8s  . Lung cancer Maternal Uncle 56       rare subtype of small cell lung cancer  . Thyroid cancer Maternal Great-grandmother 14  . Uterine cancer Maternal Great-grandmother 38  . Birth defects Neg Hx   . Hearing loss Neg Hx   . Mental retardation Neg Hx   . Stroke Neg Hx   . Varicose Veins Neg Hx   . Angioedema Neg Hx   . Eczema Neg Hx   . Seizures Neg Hx   . Schizophrenia Neg Hx   . Autism Neg Hx      Current Outpatient Medications:  .  Cholecalciferol 25 MCG (1000 UT) tablet, Take 2,000 Units by mouth at bedtime. , Disp: , Rfl:  .  cloNIDine HCl (KAPVAY) 0.1 MG TB12 ER tablet, Take 0.2 mg by mouth daily. , Disp: , Rfl: 1 .  Coenzyme Q10 (COQ10) 100 MG CAPS, Take 100 mg by mouth 2 (two) times daily. (Patient taking differently: Take 100 mg by mouth at bedtime.), Disp: 60 each, Rfl: 2 .  cyclobenzaprine (FLEXERIL) 10 MG tablet, Take 1 tablet  (10 mg total) by mouth every 8 (eight) hours as needed for muscle spasms., Disp: 30 tablet, Rfl: 1 .  letrozole (FEMARA) 2.5 MG tablet, Take 1 tablet (2.5 mg total) by mouth daily., Disp: 30 tablet, Rfl: 6 .  levothyroxine (SYNTHROID) 175 MCG tablet, Take 1 tablet (175 mcg total) by mouth daily at 6 (six) AM., Disp: 30 tablet, Rfl: 11 .  metFORMIN (GLUCOPHAGE-XR) 500 MG 24 hr tablet, Take 2 tablets (1,000 mg total) by mouth daily with supper., Disp: 60 tablet, Rfl: 11 .  Multiple Vitamins-Minerals (MULTIVITAMIN GUMMIES ADULT PO), Take by mouth., Disp: , Rfl:  .  oxcarbazepine (TRILEPTAL) 600 MG tablet, Take 900 mg by mouth 2 (two) times daily. , Disp: , Rfl: 1 .  Testosterone (ANDROGEL PUMP) 20.25 MG/ACT (1.62%) GEL, Place 1 Pump onto the skin daily., Disp: 75 g, Rfl: 1 .  VYVANSE 70 MG capsule, Take 70 mg by mouth daily., Disp: , Rfl: 0 .  fluticasone (FLOVENT HFA) 44 MCG/ACT inhaler, Inhale 2 puffs into the lungs 2 (two) times daily. (Patient not taking: Reported on 09/06/2020), Disp: 1 Inhaler, Rfl: 5 .  ibuprofen (ADVIL) 600 MG tablet, Take 1 tablet (600 mg total) by mouth every 6 (six) hours as needed. (Patient not taking: Reported on 09/06/2020), Disp: 90 tablet, Rfl: 1 .  KARBINAL ER 4 MG/5ML SUER, TAKE 7 AND 1/2 MLS TO 20 MLS (6MG TO 16 MG) BY MOUTH 2 TIMES DAILY AS NEEDED. (Patient not taking: Reported on 09/06/2020), Disp: 480 mL, Rfl: 3 .  lidocaine-prilocaine (EMLA) cream, Use as directed (Patient not taking: No sig reported), Disp: 30 g, Rfl: 0 .  loperamide (IMODIUM) 2 MG capsule, Take 2 mg by mouth as needed for diarrhea or loose stools. (Patient not taking: No sig reported), Disp: , Rfl:  .  ondansetron (ZOFRAN ODT) 4 MG disintegrating tablet, Take 1 tablet (4 mg total) by mouth  every 6 (six) hours as needed for nausea. (Patient not taking: No sig reported), Disp: 20 tablet, Rfl: 0 .  oxyCODONE (OXY IR/ROXICODONE) 5 MG immediate release tablet, Take 1 tablet (5 mg total) by mouth every 6  (six) hours as needed for severe pain. (Patient not taking: No sig reported), Disp: 8 tablet, Rfl: 0 .  pantoprazole (PROTONIX) 40 MG tablet, Take 1 tablet (40 mg total) by mouth daily before breakfast. (Patient not taking: No sig reported), Disp: 90 tablet, Rfl: 3  Allergies as of 09/06/2020 - Review Complete 09/06/2020  Allergen Reaction Noted  . Other Other (See Comments) 10/22/2010  . Sulfa antibiotics Other (See Comments) 01/13/2017     reports that Heidi Bushy "Heidi [name]" has never smoked. Heidi Bushy "Heidi [name]" has never used smokeless tobacco. Heidi Bushy "Heidi [name]" reports that [employer] "Heidi [name]" does not drink alcohol and does not use drugs. Pediatric History  Patient Parents  . Peruski,Laura (Mother)   Other Topics Concern  . Not on file  Social History Narrative   Deashia graduated high school in 01/2020. She lives with her parents. She enjoys playing with friends online, drawing, and YouTube. 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      504 in school.       Sees Dr. Darleene Cleaver       Identifies at [place]   Prefers pronouns- they/them   1. School and Family:  They have graduated high school  2. Activities: not active  3. Primary Care Provider: Leveda Anna, NP  ROS: There are no other significant problems involving Emmersyn's other body systems.    Objective:  Objective  Vital Signs:     BP 126/80   Wt (!) 301 lb 6.4 oz (136.7 kg)   BMI 53.82 kg/m     Ht Readings from Last 3 Encounters:  08/31/20 5' 2.75" (1.594 m) (27 %, Z= -0.60)*  07/19/20 _0  (1.626 m) (46 %, Z= -0.11)*  06/06/20 5' 4.96" (1.65 m) (61 %, Z= 0.27)*   * Growth percentiles are based on CDC (Girls, 2-20 Years) data.   Wt Readings from Last 3 Encounters:  09/06/20 (!) 301 lb 6.4 oz (136.7 kg) (>99 %, Z= 2.82)*  08/31/20 295 lb 12.8 oz (134.2 kg) (>99 %, Z= 2.79)*  07/19/20 292 lb (132.5 kg) (>99 %, Z= 2.76)*   * Growth percentiles are based on CDC (Girls, 2-20 Years) data.   HC Readings from Last 3  Encounters:  No data found for [facility]   Body surface area is 2.46 meters squared. No height on file for this encounter. >99 %ile (Z= 2.82) based on CDC (Girls, 2-20 Years) weight-for-age data using vitals from 09/06/2020.   PHYSICAL EXAM:    Gen:  NAD. Comfortably talking with no agitation or distress. Weight is +10 pounds from last visit HEENT: moist mucous membranes.  Normal dentition. Thyroid Scar is healing well. No erythema.  Posterior laminectomy scar also healing well.  CV: normal pulses and distal perfusion PULM: normal work of breathing ABD: soft/nontender/nondistended/normal bowel sounds. Enlarged for age.  Umbilical herniation noted (new) EXT: No edema. Well healing scar from abscess on right thigh.  Neuro: Alert and oriented x3 Skin: +1 acanthosis on posterior neck. Posterior laminectomy scar- well healed.  Area of erythema with firm center. Warm to touch. Left bicep. Line drawn around Margin. Family advised to call PCP if erythema is spreading.   LAB DATA:     Lab Results  Component Value Date   HGBA1C  6.0 (H) 06/06/2020   HGBA1C  06/06/2020     Comment:     unable to run due to low hemoglobin   HGBA1C 5.9 (A) 03/05/2020   HGBA1C 5.7 (A) 11/29/2019   HGBA1C 5.9 (A) 01/11/2019   HGBA1C 5.7 (A) 09/06/2018   HGBA1C 5.7 (H) 03/19/2018   HGBA1C 5.4 03/06/2016    Results for orders placed or performed in visit on 06/06/20  Luteinizing hormone  Result Value Ref Range   LH 0.3 (L) mIU/mL  Follicle stimulating hormone  Result Value Ref Range   FSH 2.5 mIU/mL  Testos,Total,Free and SHBG (Female)  Result Value Ref Range   Testosterone, Total, LC-MS-MS 31 2 - 45 ng/dL   Free Testosterone 5.5 0.1 - 6.4 pg/mL   Sex Hormone Binding 18 17 - 124 nmol/L  Estradiol, Ultra Sens  Result Value Ref Range   Estradiol, Ultra Sensitive 20 pg/mL  CBC with Differential/Platelet  Result Value Ref Range   WBC 9.4 3.8 - 10.8 Thousand/uL   RBC 4.83 3.80 - 5.10 Million/uL   Hemoglobin  14.2 11.7 - 15.5 g/dL   HCT 42.1 35.0 - 45.0 %   MCV 87.2 80.0 - 100.0 fL   MCH 29.4 27.0 - 33.0 pg   MCHC 33.7 32.0 - 36.0 g/dL   RDW 13.3 11.0 - 15.0 %   Platelets 421 (H) 140 - 400 Thousand/uL   MPV 10.8 7.5 - 12.5 fL   Neutro Abs 4,888 1,500 - 7,800 cells/uL   Lymphs Abs 3,685 850 - 3,900 cells/uL   Absolute Monocytes 620 200 - 950 cells/uL   Eosinophils Absolute 122 15 - 500 cells/uL   Basophils Absolute 85 0 - 200 cells/uL   Neutrophils Relative % 52 %   Total Lymphocyte 39.2 %   Monocytes Relative 6.6 %   Eosinophils Relative 1.3 %   Basophils Relative 0.9 %  Comprehensive metabolic panel  Result Value Ref Range   Glucose, Bld 95 65 - 99 mg/dL   BUN 23 (H) 7 - 20 mg/dL   Creat 0.76 0.50 - 1.00 mg/dL   BUN/Creatinine Ratio 30 (H) 6 - 22 (calc)   Sodium 139 135 - 146 mmol/L   Potassium 4.5 3.8 - 5.1 mmol/L   Chloride 104 98 - 110 mmol/L   CO2 26 20 - 32 mmol/L   Calcium 9.6 8.9 - 10.4 mg/dL   Total Protein 7.2 6.3 - 8.2 g/dL   Albumin 4.5 3.6 - 5.1 g/dL   Globulin 2.7 2.0 - 3.8 g/dL (calc)   AG Ratio 1.7 1.0 - 2.5 (calc)   Total Bilirubin 0.5 0.2 - 1.1 mg/dL   Alkaline phosphatase (APISO) 99 36 - 128 U/L   AST 26 12 - 32 U/L   ALT 60 (H) 5 - 32 U/L  Lipid panel  Result Value Ref Range   Cholesterol 255 (H) <170 mg/dL   HDL 55 >45 mg/dL   Triglycerides 216 (H) <90 mg/dL   LDL Cholesterol (Calc) 161 (H) <110 mg/dL (calc)   Total CHOL/HDL Ratio 4.6 <5.0 (calc)   Non-HDL Cholesterol (Calc) 200 (H) <120 mg/dL (calc)  Hemoglobin A1c  Result Value Ref Range   Hgb A1c MFr Bld 6.0 (H) <5.7 % of total Hgb   Mean Plasma Glucose 126 mg/dL   eAG (mmol/L) 7.0 mmol/L  TSH  Result Value Ref Range   TSH 41.37 (H) mIU/L  T4, free  Result Value Ref Range   Free T4 1.0 0.8 - 1.4 ng/dL  POCT  glycosylated hemoglobin (Hb A1C)  Result Value Ref Range   Hemoglobin A1C     HbA1c POC (<> result, manual entry)     HbA1c, POC (prediabetic range)     HbA1c, POC (controlled  diabetic range)    POCT Glucose (Device for Home Use)  Result Value Ref Range   Glucose Fasting, POC     POC Glucose 112 (A) 70 - 99 mg/dl     Pelvic Ultrasound 11/02/18. IMPRESSION: 2.8 cm complex left ovarian cyst is noted which may represent hemorrhagic cyst. Short-interval follow up ultrasound in 6-12 weeks is recommended, preferably during the week following the patient's normal menses.  Mild to moderate amount of free fluid is noted in the pelvis which may be physiologic or potentially represent ruptured ovarian cyst.  There is no evidence of ovarian torsion.    Pelvic Ultrasound 12/09/18 FINDINGS: Uterus Measurements: 6.9 x 2.5 x 3.8 cm = volume: 34.4 mL. No fibroids or other mass visualized.  Endometrium Thickness: 2.8 mm.  No focal abnormality visualized.  Right ovary Measurements: 4.2 x 1.9 x 1.9 cm = volume: 7.8 mL. Normal appearance/no adnexal mass.  Left ovary Measurements: 3.5 x 3.1 x 2.9 cm = volume: 16.4 mL. 1.8 x 1.6 x 2.1 cm simple cyst, most consistent with a normal physiologic follicular cyst/dominant follicle.  Other findings No abnormal free fluid.  IMPRESSION: Normal pelvic ultrasound.  Thyroid ultrasound 12/09/18 IMPRESSION: Abnormal appearance of the thyroid gland with both the right, and to a lesser extent, the left lobes of the thyroid replaced with predominantly colloid containing cysts. While none of the individual nodules/cysts meet imaging criteria to recommend percutaneous sampling or continued dedicated follow-up, given patient's young age and family history of thyroid cancer, potential imaging strategies could include proceeding with random ultrasound-guided fine-needle aspiration of the right lobe of the thyroid versus obtaining a 1 year follow-up thyroid ultrasound as clinically indicated.     Assessment and Plan:  Assessment  ASSESSMENT: Mackena is a 20 y.o. 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  They are gender non-binary and considering treatment options for being more androgenous. They have post surgical hypothyroidism.    Rapid weight gain - Weight has increased  - They are very upset about increase in weight - They have been working on eating/drinking better and increasing physical activity  Acanthosis - They has posterior neck acanthosis as well as some at their antecubital fossae and knuckles and in their truncal folds - Stable  Thyroid/ post surgical hypothyroidism - Had multinodular/cystic thyroid on ultrasound - Now s/p thyroidectomy 01/26/19 - clinical euthyroid - Dose increased to 175 at last visit -  Due for levels today  Menorrhagia/gender dysphoria - Supprelin implant placed 04/25/19 - significant dysphoria around breasts - Referral placed to Dr. Iran Planas in plastic surgery to discuss options for reduction/female reconstruction. - but surgery denied by insurance. Appeal also denied - discussed starting testosterone and postponing implant removal until summer. Heidi [name] feels that it is still working (amenorrhea).  - Low dose transdermal testosterone prescription written last visit was denied for unclear reasons.  - Standard dose transdermal testosterone prescription written today.     PLAN:  1. Diagnostic:A1C, Puberty labs and TFTs ordered. Heidi [name] will return tomorrow for lab draw. 2. Therapeutic: Lifestyle changes with focus on exercise and liquid sugars.  Start transdermal testosterone with androgel 1 pump under both arms at bedtime. (every other night given stronger dose) 3. Patient education: Lengthy discussion of above.  4. Follow-up:  Return in about 3 months (around 12/07/2020).      Lelon Huh, MD   Level of Service:  >60 minutes spent today reviewing the medical chart, counseling the patient/family, and documenting today's encounter.  Patient referred by Donnamae Jude, MD for concern for cushings, rapid weight gain  Copy of this note sent  to Klett, Rodman Pickle, NP

## 2020-09-07 DIAGNOSIS — R7309 Other abnormal glucose: Secondary | ICD-10-CM | POA: Diagnosis not present

## 2020-09-07 DIAGNOSIS — E890 Postprocedural hypothyroidism: Secondary | ICD-10-CM | POA: Diagnosis not present

## 2020-09-08 ENCOUNTER — Telehealth (INDEPENDENT_AMBULATORY_CARE_PROVIDER_SITE_OTHER): Payer: Self-pay

## 2020-09-08 LAB — TSH: TSH: 50.94 mIU/L — ABNORMAL HIGH

## 2020-09-08 LAB — HEMOGLOBIN A1C
Hgb A1c MFr Bld: 5.7 % of total Hgb — ABNORMAL HIGH (ref <5.7)
Mean Plasma Glucose: 117 mg/dL
eAG (mmol/L): 6.5 mmol/L

## 2020-09-08 LAB — C-PEPTIDE: C-Peptide: 9.87 ng/mL — ABNORMAL HIGH (ref 0.80–3.85)

## 2020-09-08 LAB — T4, FREE: Free T4: 1.2 ng/dL (ref 0.8–1.4)

## 2020-09-25 ENCOUNTER — Encounter (INDEPENDENT_AMBULATORY_CARE_PROVIDER_SITE_OTHER): Payer: Self-pay | Admitting: Dietician

## 2020-10-17 DIAGNOSIS — J029 Acute pharyngitis, unspecified: Secondary | ICD-10-CM | POA: Diagnosis not present

## 2020-10-17 DIAGNOSIS — B370 Candidal stomatitis: Secondary | ICD-10-CM | POA: Diagnosis not present

## 2020-11-27 DIAGNOSIS — J209 Acute bronchitis, unspecified: Secondary | ICD-10-CM | POA: Diagnosis not present

## 2020-12-10 ENCOUNTER — Ambulatory Visit (INDEPENDENT_AMBULATORY_CARE_PROVIDER_SITE_OTHER): Payer: Medicaid Other | Admitting: Pediatric Endocrinology

## 2020-12-10 ENCOUNTER — Other Ambulatory Visit: Payer: Self-pay

## 2020-12-10 ENCOUNTER — Encounter (INDEPENDENT_AMBULATORY_CARE_PROVIDER_SITE_OTHER): Payer: Self-pay | Admitting: Pediatric Endocrinology

## 2020-12-10 ENCOUNTER — Telehealth (INDEPENDENT_AMBULATORY_CARE_PROVIDER_SITE_OTHER): Payer: Self-pay | Admitting: Pediatric Endocrinology

## 2020-12-10 VITALS — BP 118/70 | HR 88 | Wt 313.0 lb

## 2020-12-10 DIAGNOSIS — F640 Transsexualism: Secondary | ICD-10-CM | POA: Diagnosis not present

## 2020-12-10 DIAGNOSIS — E890 Postprocedural hypothyroidism: Secondary | ICD-10-CM | POA: Diagnosis not present

## 2020-12-10 DIAGNOSIS — Z79818 Long term (current) use of other agents affecting estrogen receptors and estrogen levels: Secondary | ICD-10-CM

## 2020-12-10 DIAGNOSIS — R7303 Prediabetes: Secondary | ICD-10-CM | POA: Diagnosis not present

## 2020-12-10 NOTE — Patient Instructions (Addendum)
Change testosterone to M/W/F so that it's easier to stay on schedule.   Please call and schedule follow up with your psychiatrist.   Labs in the next week or so

## 2020-12-10 NOTE — Progress Notes (Signed)
Subjective:  Subjective  Patient Name: Heidi [name] Date of Birth: 2000/08/27  MRN: [id number]  Heidi [name]  Presents to our office today for follow up evaluation and management of Heidi [name] "Heidi [name]"'s weight gain, abnormal menses. They has post surgical hypothyroidism. (thyroidectomy for multinodular goiter with fm hx PTC)   HISTORY OF PRESENT ILLNESS:   Heidi [name] is a 20 y.o. Caucasian female   [name] was unaccompanied.   [place] was seen by their adolescent medicine specialist and their neurologist in the fall of 2019. They was having issues with weight gain, dysmenorrhea, lipid abnormalities, and migraines. 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    2 Heidi [name] was last seen in pediatric endocrine clinic on 09/06/20. In the interim they have been doing ok.   They have been staying with a friend for the past few days. Their friend dropped them off for their visit today.    Thyroid   They feel that they have been a lot more consistent with remembering to take their synthroid.   175 mcg of Levothyroxine daily.   They sometimes forget to take it in the morning. They will usually take it later in the day.   Sleep is good. Energy level is good.    Gender dysphoria   Supprelin implant still in place. Implant was placed by Dr. Windy Canny on 04/25/19. Family feels that it is still working very well. They have not had any cramps or PMS or other signs of menstruation.   Topical testosterone as above.   They have been taking some testosterone (topical) every other day. They have had some issues with their ADHD and their schedule.   Last dose was 6/24. Was meant to have a dose on 6/26.   1 pump topical every other day.  They wear a long sleeve pj top over it.   Depression has improved but anxiety has increased. They cannot remember if they told this to their psychiatrist. They also cannot recall when they last spoke with their psychiatrist.   They are no longer having pelvic  pain.   They have continued having back pain.    Dysphoria remains due to breasts. They were denied for a reduction by insurance (mom talked them down from getting full female reconstruction).   Pre Diabetes   Exercise has been limited by back pain. They can walk around the house- but bending over is painful.  Appetite has been "normal".   They have been drinking "a little less water" and some zero sugar sodas.  They are drinking some sugar drinks. They do have a sweet drink on Saturdays. They have had a few during the week- but trying to maintain their Saturday schedule.   They have continued to eat at [facility]. They are putting more protein and fewer condiments. 6" instead of 12". Avoiding the cookies. They are working on healthier options. They are trying to reduce cheese.      ______  They had Chairi repair - decompression and laminectomy of C1. Since then the episodic migraines have resolved.  Heidi [name] is so happy to no longer have headaches.   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   S/P SOC for decompression, C1 laminectomy, duraplasty (09/30/2019). Done by Dr Camila Li at [facility]  They had their thyroidectomy on 01/26/19 with Dr. Celine Ahr.  They had a Supprelin implant 04/25/19.   They have not had any more periods since their  implant.     ------------- Mom is having symptomatic PVCs. Dad had a 3rd stroke and had above the knee amputations.    3. Pertinent Review of Systems:  Constitutional: The patient feels "anxious". The patient seems healthy and active.  Eyes: Vision seems to be good. There are no recognized eye problems.  Neck: The patient has no complaints of anterior neck swelling, soreness, tenderness, pressure, discomfort, or difficulty swallowing.  Has scar on neck.  Heart: Heart rate increases with exercise or other physical activity. The patient has no complaints of palpitations,  irregular heart beats, chest pain, or chest pressure.   Lungs: + asthma- but on controller medication.  Gastrointestinal: Bowel movents seem normal. The patient has no complaints of excessive hunger, upset stomach, stomache aches or pains, diarrhea, or constipation. Legs: Muscle mass and strength seem normal. There are no complaints of numbness, tingling, burning, or pain. No edema is noted.  Feet: There are no obvious foot problems. There are no complaints of numbness, tingling, burning, or pain. No edema is noted. Neurologic: There are no recognized problems with muscle movement and strength, sensation, or coordination. GYN/GU: Supprelin 04/25/19. Else per HPI  PAST MEDICAL, FAMILY, AND SOCIAL HISTORY   Past Medical History:  Diagnosis Date   ADHD (attention deficit hyperactivity disorder)    Allergy    cats,   Anxiety    Asthma due to seasonal allergies    Complication of anesthesia    Cough variant asthma    Depression    Episodic mood disorder (HCC)    Family history of adverse reaction to anesthesia    mother was once combative and had PONV   Family history of kidney cancer    Family history of lung cancer    Family history of uterine cancer    Gastric ulcer    GERD (gastroesophageal reflux disease)    Hemorrhagic ovarian cyst    Left hemorrhagic ovarian cyst per mother   History of Chiari malformation     s/p decompression, C1 laminectomy 09/30/2019 at [facility]   Multinodular thyroid    Obesity    PONV (postoperative nausea and vomiting)    PTSD (post-traumatic stress disorder)    per mother   Recurrent upper respiratory infection (URI)     Family History  Problem Relation Age of Onset   Diabetes Father    Depression Father    Heart disease Father        3 MI, triple bipass   Hyperlipidemia Father    Hypertension Father    Learning disabilities Father        ADD/ADHD   Mental illness Father        bipolar   Vision loss Father        lens replacement surgery    Allergic rhinitis Father    Anxiety disorder Father    Bipolar disorder Father    ADD / ADHD Father    Asthma Mother    Arthritis Mother    Depression Mother    Allergic rhinitis Mother    Urticaria Mother    Thyroid cancer Mother 31       papillary   Asthma Brother    Learning disabilities Brother        disorder of written expression   Allergic rhinitis Brother    ADD / ADHD Brother    Kidney disease Maternal Grandmother    Miscarriages / Stillbirths Maternal Grandmother    Kidney cancer Maternal Grandmother 72       "  transitional cell"   Alcohol abuse Maternal Grandfather    Mental illness Maternal Grandfather        Alzheimers, Vascular Damention   Arthritis Paternal Grandmother    COPD Paternal Grandmother    COPD Paternal Grandfather    Immunodeficiency Paternal Grandfather    Drug abuse Maternal Uncle    Early death Maternal Uncle    Migraines Cousin    Kidney cancer Other        diagnosed in his 92s   Lung cancer Maternal Uncle 56       rare subtype of small cell lung cancer   Thyroid cancer Maternal Great-grandmother 55   Uterine cancer Maternal Great-grandmother 23   Birth defects Neg Hx    Hearing loss Neg Hx    Mental retardation Neg Hx    Stroke Neg Hx    Varicose Veins Neg Hx    Angioedema Neg Hx    Eczema Neg Hx    Seizures Neg Hx    Schizophrenia Neg Hx    Autism Neg Hx      Current Outpatient Medications:    Cholecalciferol 25 MCG (1000 UT) tablet, Take 2,000 Units by mouth at bedtime. , Disp: , Rfl:    cloNIDine HCl (KAPVAY) 0.1 MG TB12 ER tablet, Take 0.2 mg by mouth daily. , Disp: , Rfl: 1   ibuprofen (ADVIL) 600 MG tablet, Take 1 tablet (600 mg total) by mouth every 6 (six) hours as needed., Disp: 90 tablet, Rfl: 1   letrozole (FEMARA) 2.5 MG tablet, Take 1 tablet (2.5 mg total) by mouth daily., Disp: 30 tablet, Rfl: 6   levothyroxine (SYNTHROID) 175 MCG tablet, Take 1 tablet (175 mcg total) by mouth daily at 6 (six) AM., Disp: 30 tablet,  Rfl: 11   metFORMIN (GLUCOPHAGE-XR) 500 MG 24 hr tablet, Take 2 tablets (1,000 mg total) by mouth daily with supper., Disp: 60 tablet, Rfl: 11   Multiple Vitamins-Minerals (MULTIVITAMIN GUMMIES ADULT PO), Take by mouth., Disp: , Rfl:    oxcarbazepine (TRILEPTAL) 600 MG tablet, Take 900 mg by mouth 2 (two) times daily. , Disp: , Rfl: 1   Testosterone (ANDROGEL PUMP) 20.25 MG/ACT (1.62%) GEL, Place 1 Pump onto the skin daily., Disp: 75 g, Rfl: 1   VYVANSE 70 MG capsule, Take 70 mg by mouth daily., Disp: , Rfl: 0   Coenzyme Q10 (COQ10) 100 MG CAPS, Take 100 mg by mouth 2 (two) times daily. (Patient not taking: Reported on 12/10/2020), Disp: 60 each, Rfl: 2   cyclobenzaprine (FLEXERIL) 10 MG tablet, Take 1 tablet (10 mg total) by mouth every 8 (eight) hours as needed for muscle spasms. (Patient not taking: Reported on 12/10/2020), Disp: 30 tablet, Rfl: 1   fluticasone (FLOVENT HFA) 44 MCG/ACT inhaler, Inhale 2 puffs into the lungs 2 (two) times daily. (Patient not taking: No sig reported), Disp: 1 Inhaler, Rfl: 5   KARBINAL ER 4 MG/5ML SUER, TAKE 7 AND 1/2 MLS TO 20 MLS ($Remov'6MG'BjKhCa$  TO 16 MG) BY MOUTH 2 TIMES DAILY AS NEEDED. (Patient not taking: No sig reported), Disp: 480 mL, Rfl: 3   lidocaine-prilocaine (EMLA) cream, Use as directed (Patient not taking: No sig reported), Disp: 30 g, Rfl: 0   loperamide (IMODIUM) 2 MG capsule, Take 2 mg by mouth as needed for diarrhea or loose stools. (Patient not taking: No sig reported), Disp: , Rfl:    ondansetron (ZOFRAN ODT) 4 MG disintegrating tablet, Take 1 tablet (4 mg total) by mouth every 6 (six) hours  as needed for nausea. (Patient not taking: No sig reported), Disp: 20 tablet, Rfl: 0   oxyCODONE (OXY IR/ROXICODONE) 5 MG immediate release tablet, Take 1 tablet (5 mg total) by mouth every 6 (six) hours as needed for severe pain. (Patient not taking: No sig reported), Disp: 8 tablet, Rfl: 0   pantoprazole (PROTONIX) 40 MG tablet, Take 1 tablet (40 mg total) by mouth  daily before breakfast. (Patient not taking: No sig reported), Disp: 90 tablet, Rfl: 3  Allergies as of 12/10/2020 - Review Complete 12/10/2020  Allergen Reaction Noted   Other Other (See Comments) 10/22/2010   Sulfa antibiotics Other (See Comments) 01/13/2017     reports that [name] "Heidi [name]" has never smoked. [name] "Heidi [name]" has never used smokeless tobacco. [name] "Heidi [name]" reports that [employer] "Heidi [name]" does not drink alcohol and does not use drugs. Pediatric History  Patient Parents   Macke,Laura (Mother)   Other Topics Concern   Not on file  Social History Narrative   Devery graduated high school in 01/2020. She lives with her parents. She enjoys playing with friends online, drawing, and YouTube. 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      504 in school.       Sees Dr. Darleene Cleaver       Identifies at [place]   Prefers pronouns- they/them   1. School and Family:  They have graduated high school  2. Activities: not active  3. Primary Care Provider: Everardo Beals, NP  ROS: There are no other significant problems involving Geanine's other body systems.    Objective:  Objective  Vital Signs:     BP 118/70 (BP Location: Right Arm, Patient Position: Sitting, Cuff Size: Large)   Pulse 88   Wt (!) 313 lb (142 kg)   BMI 55.89 kg/m     Ht Readings from Last 3 Encounters:  08/31/20 5' 2.75" (1.594 m) (27 %, Z= -0.60)*  07/19/20 $RemoveB'5\' 4"'nfnhpBiW$  (1.626 m) (46 %, Z= -0.11)*  06/06/20 5' 4.96" (1.65 m) (61 %, Z= 0.27)*   * Growth percentiles are based on CDC (Girls, 2-20 Years) data.   Wt Readings from Last 3 Encounters:  12/10/20 (!) 313 lb (142 kg) (>99 %, Z= 2.90)*  09/06/20 (!) 301 lb 6.4 oz (136.7 kg) (>99 %, Z= 2.82)*  08/31/20 295 lb 12.8 oz (134.2 kg) (>99 %, Z= 2.79)*   * Growth percentiles are based on CDC (Girls, 2-20 Years) data.   HC Readings from Last 3 Encounters:  No data found for [facility]   Body surface area is 2.51 meters squared. No height on file for  this encounter. >99 %ile (Z= 2.90) based on CDC (Girls, 2-20 Years) weight-for-age data using vitals from 12/10/2020.   PHYSICAL EXAM:    Gen:  NAD. Comfortably talking with no agitation or distress. Weight is +12 pounds from last visit HEENT: moist mucous membranes.  Normal dentition. Thyroid Scar is healing well. No erythema.  Posterior laminectomy scar also healing well.  CV: normal pulses and distal perfusion PULM: normal work of breathing ABD: soft/nontender/nondistended/normal bowel sounds. Enlarged for age.  Umbilical herniation noted (new) EXT: No edema. Well healing scar from abscess on right thigh.  Neuro: Alert and oriented x3 Skin: +1 acanthosis on posterior neck. Posterior laminectomy scar- well healed.     LAB DATA:      Lab Results  Component Value Date   HGBA1C 5.7 (H) 09/07/2020   HGBA1C 6.0 (H) 06/06/2020   HGBA1C  06/06/2020  Comment:     unable to run due to low hemoglobin   HGBA1C 5.9 (A) 03/05/2020   HGBA1C 5.7 (A) 11/29/2019   HGBA1C 5.9 (A) 01/11/2019   HGBA1C 5.7 (A) 09/06/2018   HGBA1C 5.7 (H) 03/19/2018    Results for orders placed or performed in visit on 09/06/20  TSH  Result Value Ref Range   TSH 50.94 (H) mIU/L  T4, free  Result Value Ref Range   Free T4 1.2 0.8 - 1.4 ng/dL  C-peptide  Result Value Ref Range   C-Peptide 9.87 (H) 0.80 - 3.85 ng/mL  Hemoglobin A1c  Result Value Ref Range   Hgb A1c MFr Bld 5.7 (H) <5.7 % of total Hgb   Mean Plasma Glucose 117 mg/dL   eAG (mmol/L) 6.5 mmol/L     Pelvic Ultrasound 11/02/18. IMPRESSION: 2.8 cm complex left ovarian cyst is noted which may represent hemorrhagic cyst. Short-interval follow up ultrasound in 6-12 weeks is recommended, preferably during the week following the patient's normal menses.   Mild to moderate amount of free fluid is noted in the pelvis which may be physiologic or potentially represent ruptured ovarian cyst.   There is no evidence of ovarian torsion.    Pelvic  Ultrasound 12/09/18  FINDINGS: Uterus Measurements: 6.9 x 2.5 x 3.8 cm = volume: 34.4 mL. No fibroids or other mass visualized.   Endometrium Thickness: 2.8 mm.  No focal abnormality visualized.   Right ovary Measurements: 4.2 x 1.9 x 1.9 cm = volume: 7.8 mL. Normal appearance/no adnexal mass.   Left ovary  Measurements: 3.5 x 3.1 x 2.9 cm = volume: 16.4 mL. 1.8 x 1.6 x 2.1 cm simple cyst, most consistent with a normal physiologic follicular cyst/dominant follicle.   Other findings No abnormal free fluid.   IMPRESSION: Normal pelvic ultrasound.  Thyroid ultrasound 12/09/18 IMPRESSION: Abnormal appearance of the thyroid gland with both the right, and to a lesser extent, the left lobes of the thyroid replaced with predominantly colloid containing cysts. While none of the individual nodules/cysts meet imaging criteria to recommend percutaneous sampling or continued dedicated follow-up, given patient's young age and family history of thyroid cancer, potential imaging strategies could include proceeding with random ultrasound-guided fine-needle aspiration of the right lobe of the thyroid versus obtaining a 1 year follow-up thyroid ultrasound as clinically indicated.     Assessment and Plan:  Assessment  ASSESSMENT: Loisann is a 20 y.o. 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  They are gender non-binary and considering treatment options for being more androgenous. They have post surgical hypothyroidism.    Rapid weight gain - Weight has increased  - They are very upset about increase in weight - They have been working on eating/drinking better and increasing physical activity - They are struggling with exercise due to back pain  Acanthosis - They has posterior neck acanthosis as well as some at their antecubital fossae and knuckles and in their truncal folds - Stable  Thyroid/ post surgical hypothyroidism - Had multinodular/cystic thyroid on  ultrasound - Now s/p thyroidectomy 01/26/19 - clinical euthyroid - Dose previously increased to 175 mcg -  Due for levels today  Menorrhagia/gender dysphoria - Supprelin implant placed 04/25/19  - They would like to have it replaced - significant dysphoria around breasts - Referral placed to Dr. Iran Planas in plastic surgery to discuss options for reduction/female reconstruction. - but surgery denied by insurance. Appeal also denied - Now on low dose transdermal testosterone every other day- struggling  with staying on schedule. Changed schedule to M/W/F    PLAN:   1. Diagnostic:A1C, Puberty labs and TFTs ordered. Heidi [name] will return tomorrow for lab draw. 2. Therapeutic: Lifestyle changes with focus on exercise and liquid sugars.  Continue transdermal testosterone with androgel 1 pump under both arms at bedtime. (M/W/F) 3. Patient education: Lengthy discussion of above.  4. Follow-up: Return in about 3 months (around 03/12/2021).      Lelon Huh, MD   Level of Service:  Level of Service: This visit lasted in excess of 40 minutes. More than 50% of the visit was devoted to counseling.   Patient referred by Leveda Anna, NP for concern for cushings, rapid weight gain  Copy of this note sent to Everardo Beals, NP

## 2020-12-10 NOTE — Telephone Encounter (Signed)
[name] would like a second Supprelin implant for hormone suppression.    Thanks

## 2020-12-11 MED ORDER — SUPPRELIN LA 50 MG ~~LOC~~ KIT: PACK | SUBCUTANEOUS | 0 refills | Status: AC

## 2020-12-11 NOTE — Telephone Encounter (Signed)
Sent in script to [facility]

## 2020-12-11 NOTE — Addendum Note (Signed)
Addended by: Mike Gip A on: 12/11/2020 08:57 AM   Modules accepted: Orders

## 2020-12-14 DIAGNOSIS — Z79818 Long term (current) use of other agents affecting estrogen receptors and estrogen levels: Secondary | ICD-10-CM | POA: Diagnosis not present

## 2020-12-14 DIAGNOSIS — E890 Postprocedural hypothyroidism: Secondary | ICD-10-CM | POA: Diagnosis not present

## 2020-12-14 DIAGNOSIS — F640 Transsexualism: Secondary | ICD-10-CM | POA: Diagnosis not present

## 2020-12-14 DIAGNOSIS — R7303 Prediabetes: Secondary | ICD-10-CM | POA: Diagnosis not present

## 2020-12-20 LAB — TESTOS,TOTAL,FREE AND SHBG (FEMALE)
Free Testosterone: 8.1 pg/mL — ABNORMAL HIGH (ref 0.1–6.4)
Sex Hormone Binding: 12 nmol/L — ABNORMAL LOW (ref 17–124)
Testosterone, Total, LC-MS-MS: 40 ng/dL (ref 2–45)

## 2020-12-20 LAB — T4, FREE: Free T4: 1.6 ng/dL — ABNORMAL HIGH (ref 0.8–1.4)

## 2020-12-20 LAB — ESTRADIOL, ULTRA SENS: Estradiol, Ultra Sensitive: 18 pg/mL

## 2020-12-20 LAB — HEMOGLOBIN A1C
Hgb A1c MFr Bld: 5.8 % of total Hgb — ABNORMAL HIGH (ref <5.7)
Mean Plasma Glucose: 120 mg/dL
eAG (mmol/L): 6.6 mmol/L

## 2020-12-20 LAB — TSH: TSH: 4.22 mIU/L

## 2020-12-20 LAB — LUTEINIZING HORMONE: LH: 0.2 m[IU]/mL — ABNORMAL LOW

## 2020-12-20 NOTE — Telephone Encounter (Signed)
Called [facility] to follow up, provided updated insurance information and shipping address Scheduled Tues July 12th to [employer], $0 balance on account.  [facility] will [facility] out to patient to confirm delivery.

## 2021-01-01 ENCOUNTER — Encounter (INDEPENDENT_AMBULATORY_CARE_PROVIDER_SITE_OTHER): Payer: Self-pay

## 2021-01-21 ENCOUNTER — Telehealth (INDEPENDENT_AMBULATORY_CARE_PROVIDER_SITE_OTHER): Payer: Self-pay | Admitting: Pediatric Endocrinology

## 2021-01-21 NOTE — Telephone Encounter (Signed)
Heidi [name] called to arrange shipment of Supprelin. Please call them back at [phone]. Heidi [name]

## 2021-01-22 NOTE — Telephone Encounter (Signed)
Received message to call [employer] to schedule delivery, called [employer] to follow up.  Delivery is scheduled for Tues 16th, confirmed delivery address for [place].   Emailed [employer].

## 2021-01-22 NOTE — Telephone Encounter (Signed)
See Supprelin encounter for update

## 2021-02-12 NOTE — Telephone Encounter (Signed)
Late Entry  - Supprelin has been delivered to the [place].

## 2021-02-16 IMAGING — US US PELVIS COMPLETE WITH TRANSVAGINAL
1 series · 13 of 25 positions shown · non-contrast
Comparison: 12/09/2018

CLINICAL DATA: History of hemorrhagic cyst

EXAM:
TRANSABDOMINAL AND TRANSVAGINAL ULTRASOUND OF PELVIS
TECHNIQUE: Both transabdominal and transvaginal ultrasound examinations of the
pelvis were performed. Transabdominal technique was performed for
global imaging of the pelvis including uterus, ovaries, adnexal
regions, and pelvic cul-de-sac. It was necessary to proceed with
endovaginal exam following the transabdominal exam to visualize the
uterus endometrium ovaries.

[Series 1: us pelvis complete with transvaginal · 0.25mm/px · 13 of 97 slices shown]
[im 1/97]
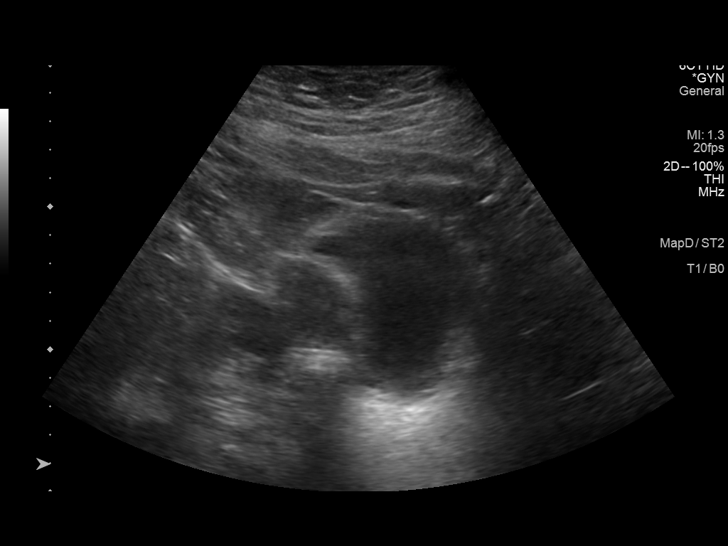
[im 9/97]
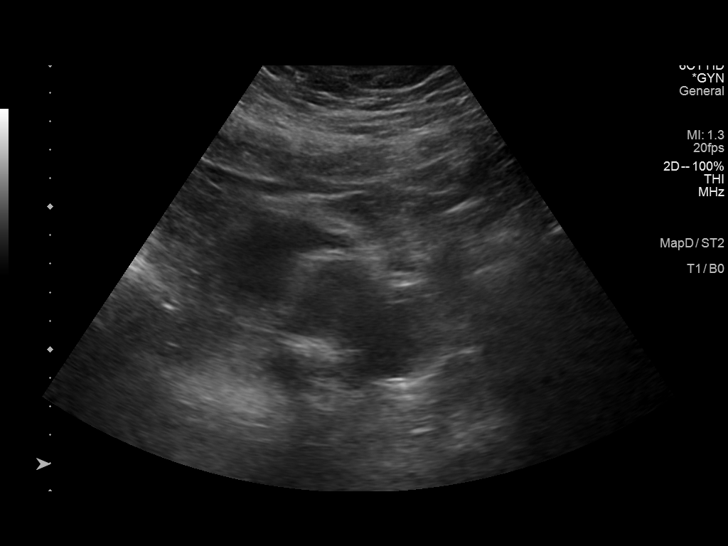
[im 17/97]
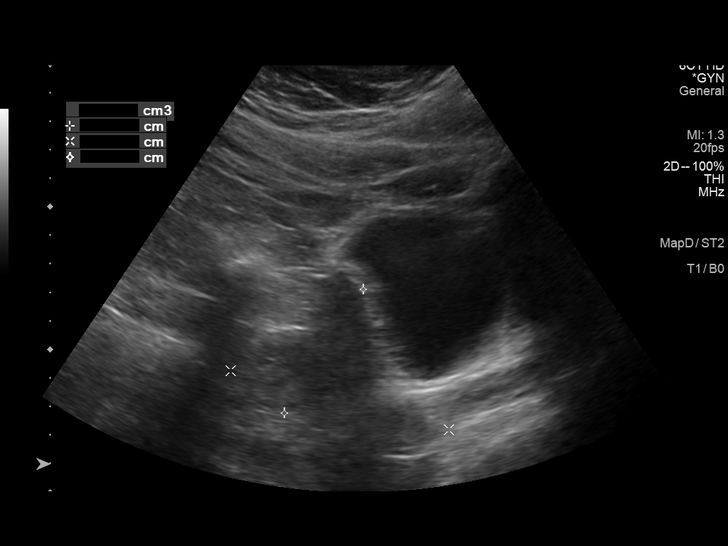
[im 25/97]
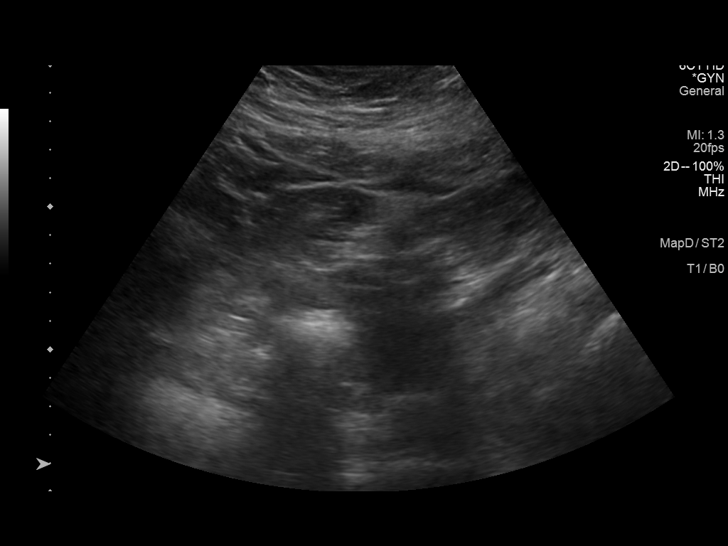
[im 33/97]
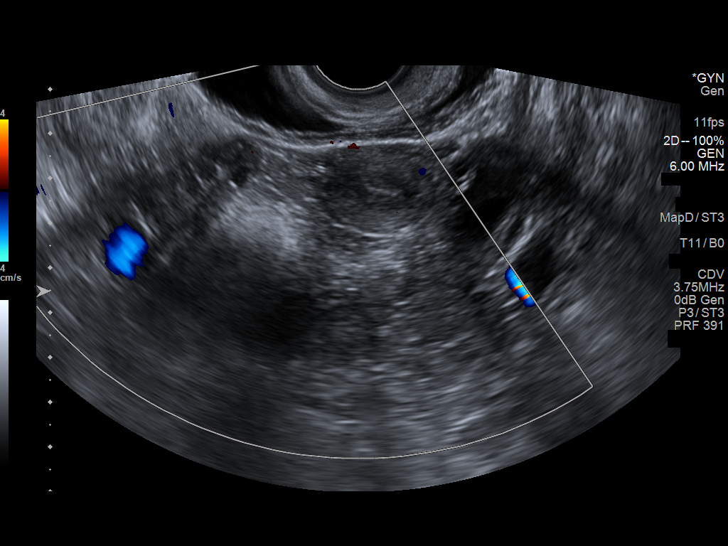
[im 41/97]
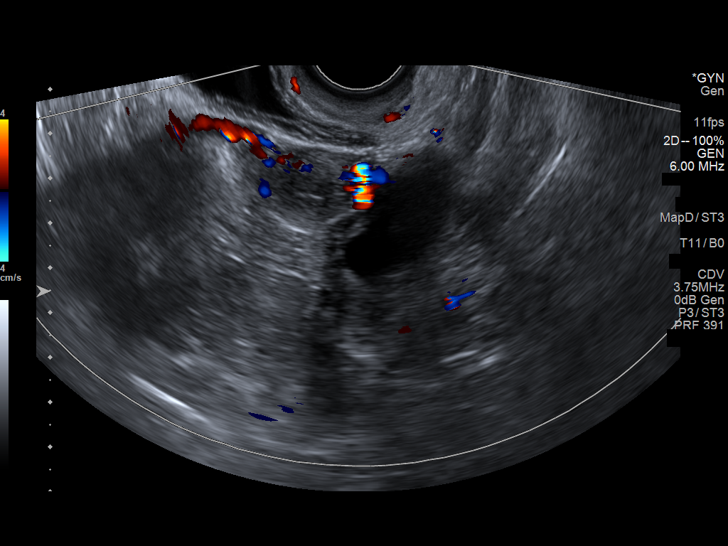
[im 49/97]
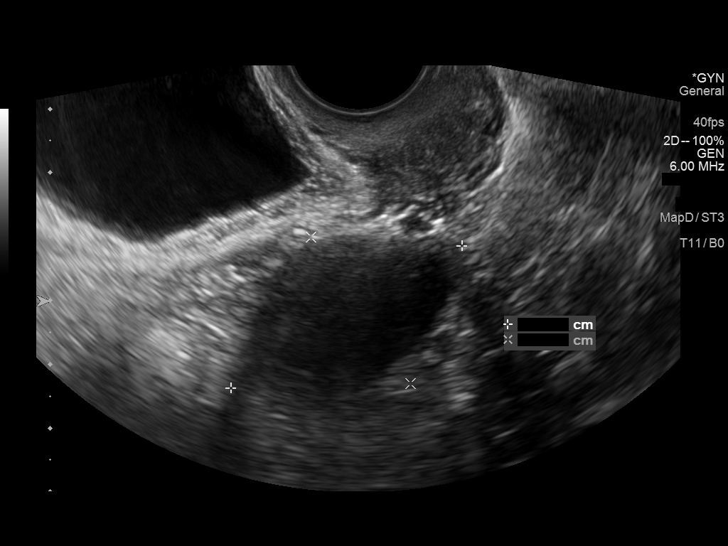
[im 57/97]
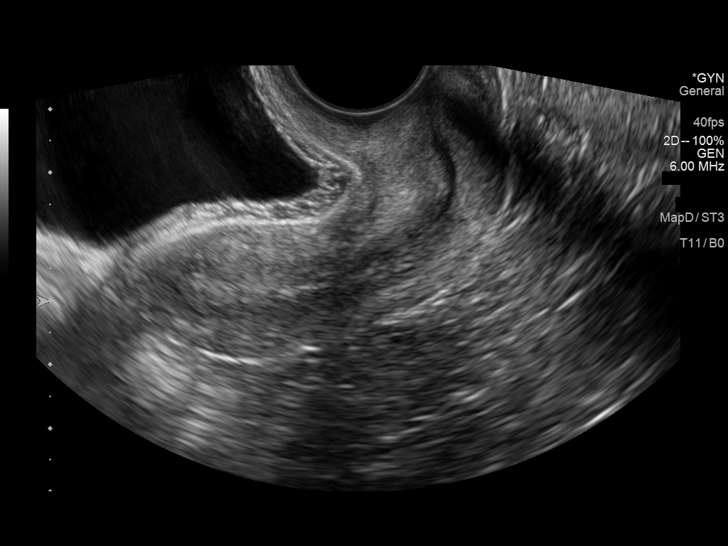
[im 65/97]
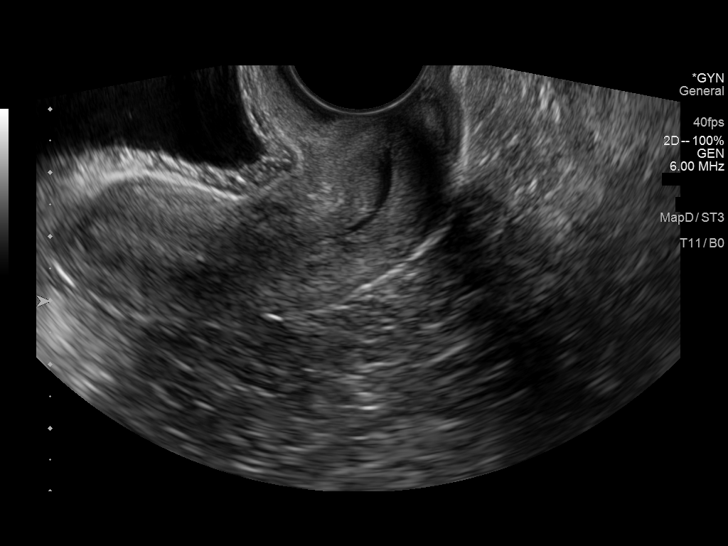
[im 73/97]
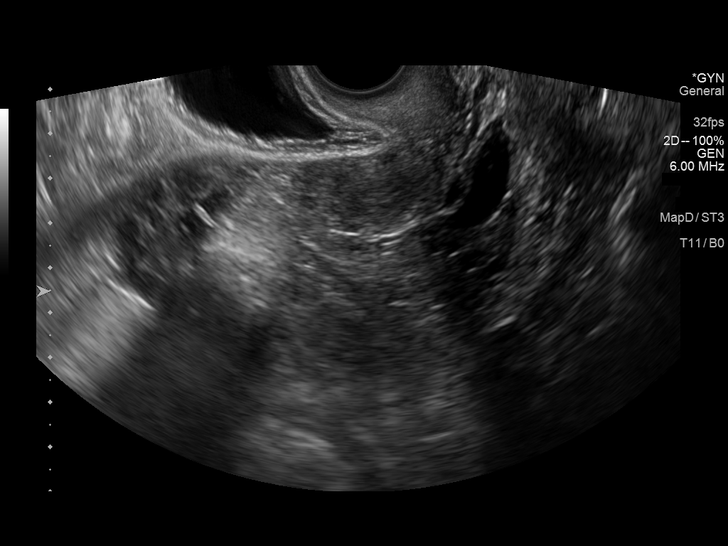
[im 81/97]
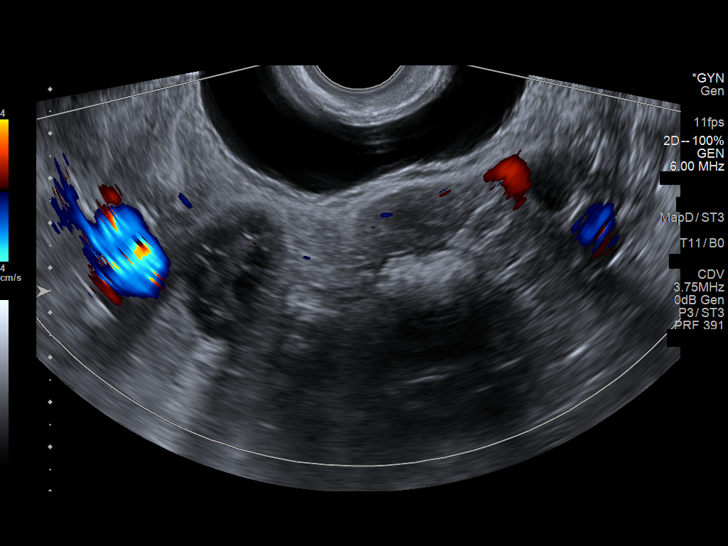
[im 89/97]
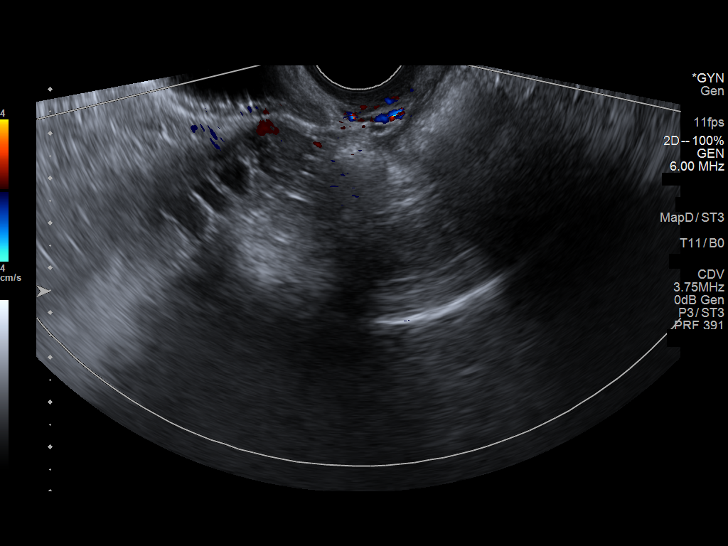
[im 97/97]
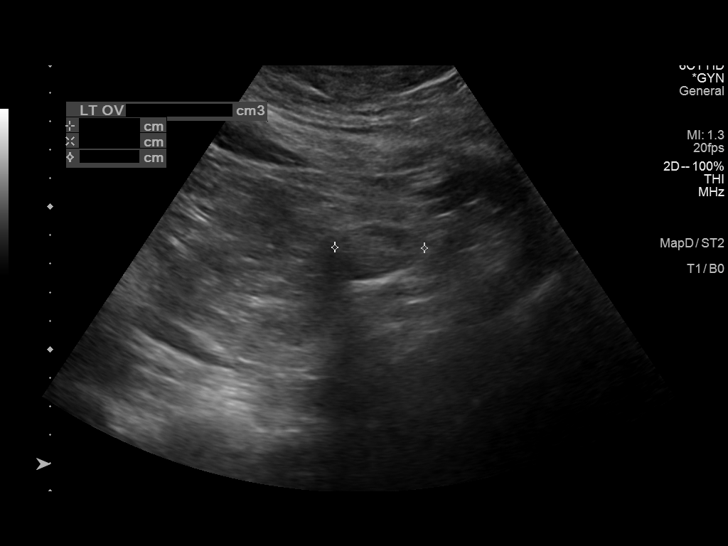

[13 of 25 positions shown; findings below may reference images not displayed]

FINDINGS: Uterus

Measurements: 6.1 x 2.1 x 3.1 cm = volume: 21 mL. No fibroids or
other mass visualized.

Endometrium

Thickness: 7 mm.  Trace fluid in the cervix.

Right ovary

Measurements: 4.2 x 2.5 x 2 cm = volume: 10.4 mL. Echogenic area
between right ovary and uterus, persistent between transabdominal
and transvaginal images.

Left ovary

Measurements: 3.5 x 2.1 x 2.5 cm = volume: 13.4 mL. Left adnexal
cyst containing internal echoes measures 4.3 x 2.8 by 2.5 cm.

Other findings

No abnormal free fluid.
IMPRESSION: 1. 4.3 cm mildly complex left adnexal cyst, possibly hemorrhagic.
Consider 6-12 week sonographic follow-up to ensure resolution.
2. Possible right adnexal echogenic mass between the right ovary and
uterus, questionable for dermoid lesion. This may also be evaluated
at sonographic follow-up.

## 2021-03-05 ENCOUNTER — Encounter: Payer: Self-pay | Admitting: General Surgery

## 2021-03-05 DIAGNOSIS — F902 Attention-deficit hyperactivity disorder, combined type: Secondary | ICD-10-CM | POA: Diagnosis not present

## 2021-03-05 DIAGNOSIS — F0630 Mood disorder due to known physiological condition, unspecified: Secondary | ICD-10-CM | POA: Diagnosis not present

## 2021-03-05 DIAGNOSIS — F3481 Disruptive mood dysregulation disorder: Secondary | ICD-10-CM | POA: Diagnosis not present

## 2021-03-26 ENCOUNTER — Encounter (HOSPITAL_COMMUNITY): Payer: Self-pay | Admitting: Surgery

## 2021-03-26 ENCOUNTER — Other Ambulatory Visit: Payer: Self-pay

## 2021-03-26 NOTE — Anesthesia Preprocedure Evaluation (Addendum)
Anesthesia Evaluation  Patient identified by MRN, date of birth, ID band Patient awake    Reviewed: Allergy & Precautions, NPO status , Patient's Chart, lab work & pertinent test results  History of Anesthesia Complications (+) PONV and history of anesthetic complications  Airway Mallampati: II  TM Distance: >3 FB Neck ROM: Full    Dental no notable dental hx. (+) Teeth Intact, Dental Advisory Given, Chipped,    Pulmonary asthma ,    Pulmonary exam normal breath sounds clear to auscultation       Cardiovascular Normal cardiovascular exam Rhythm:Regular Rate:Normal     Neuro/Psych  Headaches, PSYCHIATRIC DISORDERS Anxiety Depression S/p chiari decompression     GI/Hepatic Neg liver ROS, PUD, GERD  Controlled and Medicated,  Endo/Other  diabetes (pre-diabetic a1c 5.8), Well Controlled, Type 2, Oral Hypoglycemic AgentsHypothyroidism   Renal/GU negative Renal ROS  negative genitourinary   Musculoskeletal negative musculoskeletal ROS (+)   Abdominal (+) - obese,   Peds negative pediatric ROS (+)  Hematology negative hematology ROS (+)   Anesthesia Other Findings   Reproductive/Obstetrics negative OB ROS                            Anesthesia Physical Anesthesia Plan  ASA: 3  Anesthesia Plan: General   Post-op Pain Management:    Induction: Intravenous  PONV Risk Score and Plan: 4 or greater and Aprepitant, Ondansetron, Dexamethasone, Midazolam, Scopolamine patch - Pre-op and Treatment may vary due to age or medical condition  Airway Management Planned: LMA  Additional Equipment: None  Intra-op Plan:   Post-operative Plan: Extubation in OR  Informed Consent: I have reviewed the patients History and Physical, chart, labs and discussed the procedure including the risks, benefits and alternatives for the proposed anesthesia with the patient or authorized representative who has  indicated his/her understanding and acceptance.     Dental advisory given  Plan Discussed with: CRNA  Anesthesia Plan Comments:        Anesthesia Quick Evaluation

## 2021-03-26 NOTE — Progress Notes (Addendum)
I spoke to [employer], patient denies chest pain  or shortness of breath.  Heidi [name] reports that she has had chest discomfort at times when she is trying to take a deep breath and can't  get a deep breath, there is a pain in the chest that last a second and then goes away. Patient denies having any s/s of Covid in their household.  Heidi [name] denies any known exposure to Covid.   Heidi [name] asked for  their mother to talk to me.  Heidi [name]'s mother Heidi [name] was adding information in the background as I was speaking to Heidi [name].   I instructed patient to shower with antibiotic soap, if it is available.  Dry off with a clean towel. Do not put lotion, powder, cologne or deodorant or makeup.No jewelry or piercings. Men may shave their face and neck. Woman should not shave. No nail polish, artificial or acrylic nails. Wear clean clothes, brush your teeth. Glasses, contact lens,dentures or partials may not be worn in the OR. If you need to wear them, please bring a case for glasses, do not wear contacts or bring a case, the hospital does not have contact cases, dentures or partials will have to be removed , make sure they are clean, we will provide a denture cup to put them in. You will need some one to drive you home and a responsible person over the age of 1 to stay with you for the first 24 hours after surgery.   I spoke with Dr. Kalman Shan about the chest pain that Heidi [name] reported. Dr.Rose did not see a need for an EKG, she will be evaluated in am.

## 2021-03-27 ENCOUNTER — Ambulatory Visit (HOSPITAL_COMMUNITY)
Admission: RE | Admit: 2021-03-27 | Discharge: 2021-03-27 | Disposition: A | Discharge status: Home / Self Care | Payer: Medicaid Other | Attending: Surgery | Admitting: Surgery

## 2021-03-27 ENCOUNTER — Encounter (HOSPITAL_COMMUNITY)
Admission: RE | Disposition: A | Discharge status: Home / Self Care | Payer: Self-pay | Source: Home / Self Care | Attending: Surgery

## 2021-03-27 ENCOUNTER — Ambulatory Visit (HOSPITAL_COMMUNITY): Payer: Medicaid Other | Admitting: Anesthesiology

## 2021-03-27 ENCOUNTER — Encounter (HOSPITAL_COMMUNITY): Payer: Self-pay | Admitting: Surgery

## 2021-03-27 DIAGNOSIS — Z825 Family history of asthma and other chronic lower respiratory diseases: Secondary | ICD-10-CM | POA: Insufficient documentation

## 2021-03-27 DIAGNOSIS — Z8051 Family history of malignant neoplasm of kidney: Secondary | ICD-10-CM | POA: Diagnosis not present

## 2021-03-27 DIAGNOSIS — E559 Vitamin D deficiency, unspecified: Secondary | ICD-10-CM | POA: Diagnosis not present

## 2021-03-27 DIAGNOSIS — Z791 Long term (current) use of non-steroidal anti-inflammatories (NSAID): Secondary | ICD-10-CM | POA: Diagnosis not present

## 2021-03-27 DIAGNOSIS — R7303 Prediabetes: Secondary | ICD-10-CM | POA: Diagnosis not present

## 2021-03-27 DIAGNOSIS — Z7951 Long term (current) use of inhaled steroids: Secondary | ICD-10-CM | POA: Diagnosis not present

## 2021-03-27 DIAGNOSIS — Z882 Allergy status to sulfonamides status: Secondary | ICD-10-CM | POA: Diagnosis not present

## 2021-03-27 DIAGNOSIS — Z9109 Other allergy status, other than to drugs and biological substances: Secondary | ICD-10-CM | POA: Insufficient documentation

## 2021-03-27 DIAGNOSIS — Z808 Family history of malignant neoplasm of other organs or systems: Secondary | ICD-10-CM | POA: Diagnosis not present

## 2021-03-27 DIAGNOSIS — Z833 Family history of diabetes mellitus: Secondary | ICD-10-CM | POA: Diagnosis not present

## 2021-03-27 DIAGNOSIS — Z818 Family history of other mental and behavioral disorders: Secondary | ICD-10-CM | POA: Diagnosis not present

## 2021-03-27 DIAGNOSIS — G43009 Migraine without aura, not intractable, without status migrainosus: Secondary | ICD-10-CM | POA: Diagnosis not present

## 2021-03-27 DIAGNOSIS — Z7984 Long term (current) use of oral hypoglycemic drugs: Secondary | ICD-10-CM | POA: Insufficient documentation

## 2021-03-27 DIAGNOSIS — J0100 Acute maxillary sinusitis, unspecified: Secondary | ICD-10-CM

## 2021-03-27 DIAGNOSIS — Z841 Family history of disorders of kidney and ureter: Secondary | ICD-10-CM | POA: Insufficient documentation

## 2021-03-27 DIAGNOSIS — F649 Gender identity disorder, unspecified: Secondary | ICD-10-CM | POA: Diagnosis not present

## 2021-03-27 DIAGNOSIS — Z8249 Family history of ischemic heart disease and other diseases of the circulatory system: Secondary | ICD-10-CM | POA: Insufficient documentation

## 2021-03-27 DIAGNOSIS — F909 Attention-deficit hyperactivity disorder, unspecified type: Secondary | ICD-10-CM | POA: Diagnosis not present

## 2021-03-27 DIAGNOSIS — Z6841 Body Mass Index (BMI) 40.0 and over, adult: Secondary | ICD-10-CM | POA: Diagnosis not present

## 2021-03-27 DIAGNOSIS — Z801 Family history of malignant neoplasm of trachea, bronchus and lung: Secondary | ICD-10-CM | POA: Insufficient documentation

## 2021-03-27 DIAGNOSIS — F640 Transsexualism: Secondary | ICD-10-CM | POA: Insufficient documentation

## 2021-03-27 DIAGNOSIS — J3089 Other allergic rhinitis: Secondary | ICD-10-CM

## 2021-03-27 HISTORY — PX: REMOVAL AND REPLACEMENT SUPPRELIN IMPLANT PEDIATRIC: SHX6761

## 2021-03-27 HISTORY — DX: Hypothyroidism, unspecified: E03.9

## 2021-03-27 HISTORY — DX: Anemia, unspecified: D64.9

## 2021-03-27 LAB — CBC
HCT: 45.5 % (ref 36.0–46.0)
Hemoglobin: 14.8 g/dL (ref 12.0–15.0)
MCH: 28.1 pg (ref 26.0–34.0)
MCHC: 32.5 g/dL (ref 30.0–36.0)
MCV: 86.5 fL (ref 80.0–100.0)
Platelets: 387 10*3/uL (ref 150–400)
RBC: 5.26 MIL/uL — ABNORMAL HIGH (ref 3.87–5.11)
RDW: 13.3 % (ref 11.5–15.5)
WBC: 8.7 10*3/uL (ref 4.0–10.5)
nRBC: 0 % (ref 0.0–0.2)

## 2021-03-27 LAB — GLUCOSE, CAPILLARY: Glucose-Capillary: 131 mg/dL — ABNORMAL HIGH (ref 70–99)

## 2021-03-27 LAB — POCT PREGNANCY, URINE: Preg Test, Ur: NEGATIVE

## 2021-03-27 SURGERY — REPLACEMENT, HISTRELIN ACETATE SUBCUTANEOUS IMPLANT
Anesthesia: General | Site: Arm Upper | Laterality: Left

## 2021-03-27 MED ORDER — ACETAMINOPHEN 500 MG PO TABS
1000.0000 mg | ORAL_TABLET | Freq: Once | ORAL | Status: AC
Start: 2021-03-27 — End: 2021-03-27
  Administered 2021-03-27: 1000 mg via ORAL
  Filled 2021-03-27: qty 2

## 2021-03-27 MED ORDER — FENTANYL CITRATE (PF) 250 MCG/5ML IJ SOLN
INTRAMUSCULAR | Status: AC
  Filled 2021-03-27: qty 5

## 2021-03-27 MED ORDER — MIDAZOLAM HCL 2 MG/2ML IJ SOLN
INTRAMUSCULAR | Status: AC
  Filled 2021-03-27: qty 2

## 2021-03-27 MED ORDER — DEXMEDETOMIDINE (PRECEDEX) IN NS 20 MCG/5ML (4 MCG/ML) IV SYRINGE
PREFILLED_SYRINGE | INTRAVENOUS | Status: DC | PRN
  Administered 2021-03-27: 12 ug via INTRAVENOUS

## 2021-03-27 MED ORDER — DEXAMETHASONE SODIUM PHOSPHATE 10 MG/ML IJ SOLN
INTRAMUSCULAR | Status: DC | PRN
  Administered 2021-03-27: 5 mg via INTRAVENOUS

## 2021-03-27 MED ORDER — IBUPROFEN 800 MG PO TABS: 800.0000 mg | ORAL_TABLET | Freq: Four times a day (QID) | ORAL | 0 refills | Status: AC | PRN

## 2021-03-27 MED ORDER — DEXMEDETOMIDINE (PRECEDEX) IN NS 20 MCG/5ML (4 MCG/ML) IV SYRINGE
PREFILLED_SYRINGE | INTRAVENOUS | Status: AC
  Filled 2021-03-27: qty 5

## 2021-03-27 MED ORDER — PROPOFOL 10 MG/ML IV BOLUS
INTRAVENOUS | Status: DC | PRN
  Administered 2021-03-27: 300 mg via INTRAVENOUS

## 2021-03-27 MED ORDER — APREPITANT 40 MG PO CAPS
40.0000 mg | ORAL_CAPSULE | Freq: Once | ORAL | Status: AC
  Administered 2021-03-27: 40 mg via ORAL
  Filled 2021-03-27: qty 1

## 2021-03-27 MED ORDER — ORAL CARE MOUTH RINSE: 15.0000 mL | Freq: Once | OROMUCOSAL | Status: AC

## 2021-03-27 MED ORDER — FENTANYL CITRATE (PF) 250 MCG/5ML IJ SOLN
INTRAMUSCULAR | Status: DC | PRN
  Administered 2021-03-27: 150 ug via INTRAVENOUS

## 2021-03-27 MED ORDER — CEFAZOLIN SODIUM-DEXTROSE 2-4 GM/100ML-% IV SOLN
2.0000 g | INTRAVENOUS | Status: AC
  Administered 2021-03-27: 3 g via INTRAVENOUS
  Filled 2021-03-27: qty 100

## 2021-03-27 MED ORDER — CHLORHEXIDINE GLUCONATE 0.12 % MT SOLN
15.0000 mL | Freq: Once | OROMUCOSAL | Status: AC
  Administered 2021-03-27: 15 mL via OROMUCOSAL
  Filled 2021-03-27: qty 15

## 2021-03-27 MED ORDER — PHENYLEPHRINE HCL (PRESSORS) 10 MG/ML IV SOLN
INTRAVENOUS | Status: DC | PRN
  Administered 2021-03-27: 40 ug via INTRAVENOUS
  Administered 2021-03-27 (×5): 80 ug via INTRAVENOUS

## 2021-03-27 MED ORDER — ONDANSETRON HCL 4 MG/2ML IJ SOLN
INTRAMUSCULAR | Status: AC
  Filled 2021-03-27: qty 2

## 2021-03-27 MED ORDER — ACETAMINOPHEN 500 MG PO TABS: 1000.0000 mg | ORAL_TABLET | Freq: Four times a day (QID) | ORAL | Status: DC | PRN

## 2021-03-27 MED ORDER — PHENYLEPHRINE 40 MCG/ML (10ML) SYRINGE FOR IV PUSH (FOR BLOOD PRESSURE SUPPORT)
PREFILLED_SYRINGE | INTRAVENOUS | Status: AC
  Filled 2021-03-27: qty 10

## 2021-03-27 MED ORDER — 0.9 % SODIUM CHLORIDE (POUR BTL) OPTIME
TOPICAL | Status: DC | PRN
  Administered 2021-03-27: 1000 mL

## 2021-03-27 MED ORDER — ONDANSETRON HCL 4 MG/2ML IJ SOLN
INTRAMUSCULAR | Status: DC | PRN
  Administered 2021-03-27: 4 mg via INTRAVENOUS

## 2021-03-27 MED ORDER — SUPPRELIN KIT LIDOCAINE-EPINEPHRINE 1 %-1:100000 IJ SOLN (NO CHARGE)
INTRAMUSCULAR | Status: DC | PRN
  Administered 2021-03-27: 20 mL via SUBCUTANEOUS

## 2021-03-27 MED ORDER — MIDAZOLAM HCL 5 MG/5ML IJ SOLN
INTRAMUSCULAR | Status: DC | PRN
  Administered 2021-03-27: 4 mg via INTRAVENOUS

## 2021-03-27 MED ORDER — SCOPOLAMINE 1 MG/3DAYS TD PT72
1.0000 | MEDICATED_PATCH | TRANSDERMAL | Status: DC
  Administered 2021-03-27: 1.5 mg via TRANSDERMAL
  Filled 2021-03-27: qty 1

## 2021-03-27 MED ORDER — LIDOCAINE 2% (20 MG/ML) 5 ML SYRINGE
INTRAMUSCULAR | Status: DC | PRN
  Administered 2021-03-27: 60 mg via INTRAVENOUS

## 2021-03-27 MED ORDER — LACTATED RINGERS IV SOLN: INTRAVENOUS | Status: DC

## 2021-03-27 SURGICAL SUPPLY — 37 items
APL SKNCLS STERI-STRIP NONHPOA (GAUZE/BANDAGES/DRESSINGS) ×1
BAG COUNTER SPONGE SURGICOUNT (BAG) ×2 IMPLANT
BAG SPNG CNTER NS LX DISP (BAG) ×1
BENZOIN TINCTURE PRP APPL 2/3 (GAUZE/BANDAGES/DRESSINGS) ×1 IMPLANT
BNDG COHESIVE 1X5 TAN STRL LF (GAUZE/BANDAGES/DRESSINGS) ×1 IMPLANT
CHLORAPREP W/TINT 10.5 ML (MISCELLANEOUS) IMPLANT
CLSR STERI-STRIP ANTIMIC 1/2X4 (GAUZE/BANDAGES/DRESSINGS) ×1 IMPLANT
COVER SURGICAL LIGHT HANDLE (MISCELLANEOUS) ×2 IMPLANT
DRAPE INCISE IOBAN 66X45 STRL (DRAPES) ×2 IMPLANT
DRAPE LAPAROTOMY 100X72 PEDS (DRAPES) IMPLANT
ELECT COATED BLADE 2.86 ST (ELECTRODE) IMPLANT
ELECT NDL BLADE 2-5/6 (NEEDLE) ×1 IMPLANT
ELECT NEEDLE BLADE 2-5/6 (NEEDLE) ×2 IMPLANT
ELECT REM PT RETURN 9FT ADLT (ELECTROSURGICAL)
ELECT REM PT RETURN 9FT PED (ELECTROSURGICAL)
ELECTRODE REM PT RETRN 9FT PED (ELECTROSURGICAL) IMPLANT
ELECTRODE REM PT RTRN 9FT ADLT (ELECTROSURGICAL) IMPLANT
FLASHLIGHT (MISCELLANEOUS) ×2 IMPLANT
GLOVE SURG SYN 7.5  E (GLOVE) ×2
GLOVE SURG SYN 7.5 E (GLOVE) ×1 IMPLANT
GLOVE SURG SYN 7.5 PF PI (GLOVE) ×1 IMPLANT
GOWN STRL REUS W/ TWL LRG LVL3 (GOWN DISPOSABLE) ×1 IMPLANT
GOWN STRL REUS W/ TWL XL LVL3 (GOWN DISPOSABLE) ×1 IMPLANT
GOWN STRL REUS W/TWL LRG LVL3 (GOWN DISPOSABLE) ×2
GOWN STRL REUS W/TWL XL LVL3 (GOWN DISPOSABLE) ×2
KIT BASIN OR (CUSTOM PROCEDURE TRAY) ×2 IMPLANT
KIT TURNOVER KIT B (KITS) ×2 IMPLANT
NDL HYPO 25GX1X1/2 BEV (NEEDLE) ×1 IMPLANT
NEEDLE HYPO 25GX1X1/2 BEV (NEEDLE) ×2 IMPLANT
NS IRRIG 1000ML POUR BTL (IV SOLUTION) IMPLANT
PACK SURGICAL SETUP 50X90 (CUSTOM PROCEDURE TRAY) ×2 IMPLANT
PENCIL BUTTON HOLSTER BLD 10FT (ELECTRODE) IMPLANT
POSITIONER HEAD DONUT 9IN (MISCELLANEOUS) ×2 IMPLANT
SPONGE GAUZE 2X2 8PLY STRL LF (GAUZE/BANDAGES/DRESSINGS) ×1 IMPLANT
STRIP CLOSURE SKIN 1/2X4 (GAUZE/BANDAGES/DRESSINGS) ×2 IMPLANT
Supprelin LA (histrelin acetate subcutaneous impla ×1 IMPLANT
TOWEL GREEN STERILE (TOWEL DISPOSABLE) ×2 IMPLANT

## 2021-03-27 NOTE — Transfer of Care (Signed)
Immediate Anesthesia Transfer of Care Note  Patient: Heidi [name]  Procedure(s) Performed: REMOVAL AND REPLACEMENT SUPPRELIN IMPLANT PEDIATRIC (Left: Arm Upper)  Patient Location: PACU  Anesthesia Type:General  Level of Consciousness: drowsy  Airway & Oxygen Therapy: Patient Spontanous Breathing and Patient connected to nasal cannula oxygen  Post-op Assessment: Report given to RN and Post -op Vital signs reviewed and stable  Post vital signs: Reviewed and stable  Last Vitals:  Vitals Value Taken Time  BP 93/59 03/27/21 1300  Temp    Pulse 71 03/27/21 1303  Resp 15 03/27/21 1303  SpO2 95 % 03/27/21 1303  Vitals shown include unvalidated device data.  Last Pain:  Vitals:   03/27/21 0949  TempSrc:   PainSc: 0-No pain         Complications: No notable events documented.

## 2021-03-27 NOTE — Anesthesia Procedure Notes (Addendum)
Procedure Name: LMA Insertion Date/Time: 03/27/2021 11:50 AM Performed by: Vonna Drafts, CRNA Pre-anesthesia Checklist: Patient identified, Emergency Drugs available, Suction available, Patient being monitored and Timeout performed Patient Re-evaluated:Patient Re-evaluated prior to induction Oxygen Delivery Method: Circle system utilized Preoxygenation: Pre-oxygenation with 100% oxygen Induction Type: IV induction Ventilation: Mask ventilation without difficulty LMA: LMA inserted LMA Size: 4.0 Number of attempts: 1 Dental Injury: Teeth and Oropharynx as per pre-operative assessment

## 2021-03-27 NOTE — Op Note (Signed)
  Operative Note   03/27/2021   PRE-OP DIAGNOSIS: GENDER DYSPHORIA   POST-OP DIAGNOSIS: GENDER DYSPHORIA  Procedure(s): REMOVAL AND REPLACEMENT SUPPRELIN IMPLANT   SURGEON: Surgeon(s) and Role:    * Leeum Sankey, Dannielle Huh, MD - Primary  ANESTHESIA: General  OPERATIVE REPORT  INDICATION FOR PROCEDURE: Heidi [name]  is a 20 y.o. adult  with gender dysphoria who was recommended for replacement of Supprelin implant. All of the risks, benefits, and complications of planned procedure, including but not limited to death, infection, and bleeding were explained to the family who understand and are eager to proceed.  PROCEDURE IN DETAIL: The patient was placed in a supine position. After undergoing proper identification and time out procedures, the patient was placed under laryngeal mask airway general anesthesia. The left upper arm was prepped and draped in standard, sterile fashion. We began by opening the previous incision on the left upper arm without difficulty. The previous implant was removed and discarded. A new Supprelin implant (50 mg, lot # [id number] , expiration date DEC-2023)  was placed without difficulty. The incision was closed. Local anesthetic was injected at the incision site. The patient tolerated the procedure well, and there were no complications. Instrument and sponge counts were correct.   ESTIMATED BLOOD LOSS: minimal  COMPLICATIONS: None  DISPOSITION: PACU - hemodynamically stable  ATTESTATION:  I performed the procedure  Stanford Scotland, MD

## 2021-03-27 NOTE — Anesthesia Postprocedure Evaluation (Signed)
Anesthesia Post Note  Patient: Heidi [name]  Procedure(s) Performed: REMOVAL AND REPLACEMENT SUPPRELIN IMPLANT PEDIATRIC (Left: Arm Upper)     Patient location during evaluation: PACU Anesthesia Type: General Level of consciousness: awake and alert Pain management: pain level controlled Vital Signs Assessment: post-procedure vital signs reviewed and stable Respiratory status: spontaneous breathing, nonlabored ventilation and respiratory function stable Cardiovascular status: blood pressure returned to baseline and stable Postop Assessment: no apparent nausea or vomiting Anesthetic complications: no   No notable events documented.  Last Vitals:  Vitals:   03/27/21 1315 03/27/21 1330  BP: 117/60 120/66  Pulse: 68 63  Resp: 17 16  Temp:    SpO2: 97% 95%    Last Pain:  Vitals:   03/27/21 1330  TempSrc:   PainSc: 0-No pain                 Pervis Hocking

## 2021-03-27 NOTE — Discharge Instructions (Signed)
   Pediatric Surgery Discharge Instructions - Supprelin    Discharge Instructions - Supprelin Implant/Removal Remove the bandage around the arm a day after the operation. If your child feels the bandage is tight, you may remove it sooner. There will be a small piece of gauze on the Steri-Strips. Your child will have Steri-Strips on the incision. This should fall off on its own. If after two weeks the strip is still covering the incision, please remove. Stitches in the incision is dissolvable, removal is not necessary. It is not necessary to apply ointments on any of the incisions. Administer acetaminophen (i.e. Tylenol) or ibuprofen (i.e. Motrin or Advil) for pain (follow instructions on label carefully). Do not give acetaminophen and ibuprofen at the same time. You can alternate the two medications. No contact sports for three weeks. No swimming or submersion in water for two weeks. Shower and/or sponge baths are okay. Contact office if any of the following occur: Fever above 101 degrees Redness and/or drainage from incision site Increased pain not relieved by narcotic pain medication Vomiting and/or diarrhea Please call our office at [phone] with any questions or concerns.  
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
## 2021-03-28 ENCOUNTER — Encounter (HOSPITAL_COMMUNITY): Payer: Self-pay | Admitting: Surgery

## 2021-04-04 ENCOUNTER — Telehealth (INDEPENDENT_AMBULATORY_CARE_PROVIDER_SITE_OTHER): Payer: Self-pay | Admitting: Nurse Practitioner

## 2021-04-04 NOTE — Telephone Encounter (Signed)
I spoke to Heidi [name] to check on their post-op recovery.  Heidi [name] is POD #8 s/p supprelin removal and replacement.  Activity level: normal Pain: none Last dose pain medication: taking ibuprofen for back pain (chronic), but not related to surgery Incisions: healing well, steri-strips have fallen off  I reviewed post-op instructions regarding bathing, swimming, and activity level. Heidi [name] does not require a follow up office appointment. Heidi [name] was encouraged to call the office with any questions or concerns.

## 2021-04-29 DIAGNOSIS — Z9089 Acquired absence of other organs: Secondary | ICD-10-CM | POA: Diagnosis not present

## 2021-04-29 DIAGNOSIS — R221 Localized swelling, mass and lump, neck: Secondary | ICD-10-CM | POA: Diagnosis not present

## 2021-05-30 DIAGNOSIS — E890 Postprocedural hypothyroidism: Secondary | ICD-10-CM | POA: Diagnosis not present

## 2021-05-30 DIAGNOSIS — M7989 Other specified soft tissue disorders: Secondary | ICD-10-CM | POA: Diagnosis not present

## 2021-05-31 DIAGNOSIS — F902 Attention-deficit hyperactivity disorder, combined type: Secondary | ICD-10-CM | POA: Diagnosis not present

## 2021-05-31 DIAGNOSIS — F0630 Mood disorder due to known physiological condition, unspecified: Secondary | ICD-10-CM | POA: Diagnosis not present

## 2021-05-31 DIAGNOSIS — F3481 Disruptive mood dysregulation disorder: Secondary | ICD-10-CM | POA: Diagnosis not present

## 2021-06-06 ENCOUNTER — Other Ambulatory Visit: Payer: Self-pay | Admitting: Surgery

## 2021-06-06 DIAGNOSIS — E890 Postprocedural hypothyroidism: Secondary | ICD-10-CM

## 2021-06-06 DIAGNOSIS — M7989 Other specified soft tissue disorders: Secondary | ICD-10-CM

## 2021-06-12 ENCOUNTER — Ambulatory Visit
Admission: RE | Admit: 2021-06-12 | Discharge: 2021-06-12 | Disposition: A | Discharge status: Home / Self Care | Payer: Medicaid Other | Source: Ambulatory Visit | Attending: Surgery | Admitting: Surgery

## 2021-06-12 ENCOUNTER — Other Ambulatory Visit: Payer: Self-pay

## 2021-06-12 DIAGNOSIS — M7989 Other specified soft tissue disorders: Secondary | ICD-10-CM

## 2021-06-12 DIAGNOSIS — E890 Postprocedural hypothyroidism: Secondary | ICD-10-CM

## 2021-06-20 NOTE — Progress Notes (Signed)
Ultrasound shows disturbed fat planes beneath incision, likely fat necrosis.  No abnormal lymph nodes or masses beneath incision.  Should improve with time.  However, there is a residual tissue mass in the right thyroid bed.  Will require follow up ultrasound in 6 months.  Claiborne Billings - please arrange repeat thyroid ultrasound in May 2023 followed by an office visit with me for exam and discussion.  [place], MD [facility] [place] [facility] Office: [phone]

## 2021-06-25 DIAGNOSIS — M7989 Other specified soft tissue disorders: Secondary | ICD-10-CM | POA: Diagnosis not present

## 2021-06-25 DIAGNOSIS — E890 Postprocedural hypothyroidism: Secondary | ICD-10-CM | POA: Diagnosis not present

## 2021-07-08 DIAGNOSIS — T888XXA Other specified complications of surgical and medical care, not elsewhere classified, initial encounter: Secondary | ICD-10-CM | POA: Diagnosis not present

## 2021-07-08 DIAGNOSIS — L905 Scar conditions and fibrosis of skin: Secondary | ICD-10-CM | POA: Diagnosis not present

## 2021-07-08 DIAGNOSIS — Z9889 Other specified postprocedural states: Secondary | ICD-10-CM | POA: Diagnosis not present

## 2021-07-08 DIAGNOSIS — Z6841 Body Mass Index (BMI) 40.0 and over, adult: Secondary | ICD-10-CM | POA: Diagnosis not present

## 2021-07-10 ENCOUNTER — Other Ambulatory Visit: Payer: Self-pay | Admitting: Otolaryngology

## 2021-07-10 DIAGNOSIS — L988 Other specified disorders of the skin and subcutaneous tissue: Secondary | ICD-10-CM

## 2021-07-26 ENCOUNTER — Other Ambulatory Visit (INDEPENDENT_AMBULATORY_CARE_PROVIDER_SITE_OTHER): Payer: Self-pay | Admitting: Pediatric Endocrinology

## 2021-08-06 IMAGING — CT CT ABD-PELV W/ CM
2 of 4 series · 16 of 46 positions shown, 18 images · IV contrast (omnipaque)
Comparison: MRI pelvis 02/02/2020

CLINICAL DATA: Wound infection

EXAM:
CT ABDOMEN AND PELVIS WITH CONTRAST
TECHNIQUE: Multidetector CT imaging of the abdomen and pelvis was performed
using the standard protocol following bolus administration of
intravenous contrast.
CONTRAST:  125mL OMNIPAQUE IOHEXOL 300 MG/ML  SOLN

[Series 3: abdomen 5.0 · axial · 0.97mm/px · z∈[-488,-68]mm · 13 of 99 slices shown, 15 images]
[im 8/99  soft-tissue]
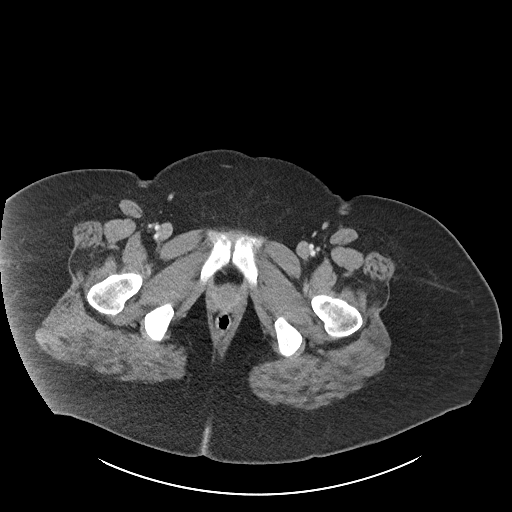
[im 8/99  bone]
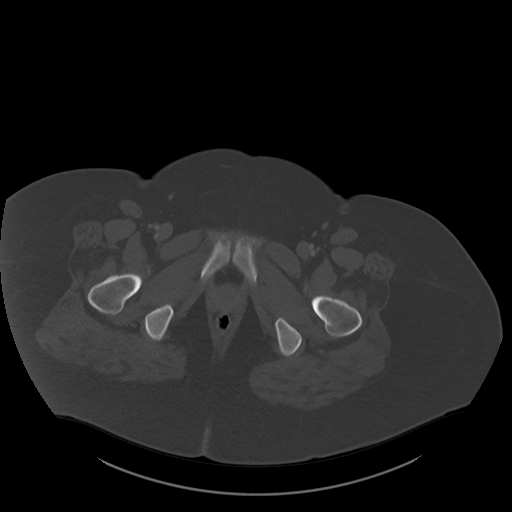
[im 15/99  soft-tissue]
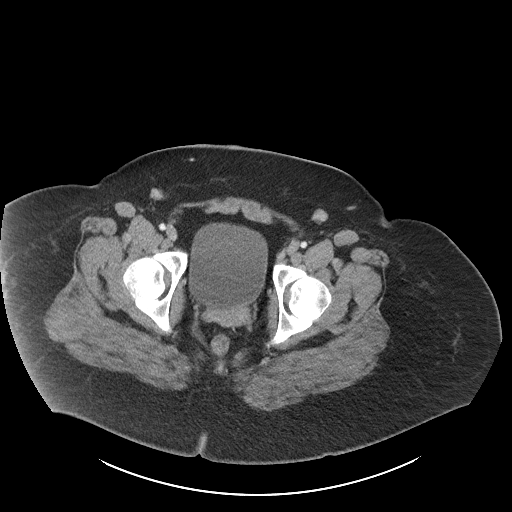
[im 22/99  soft-tissue]
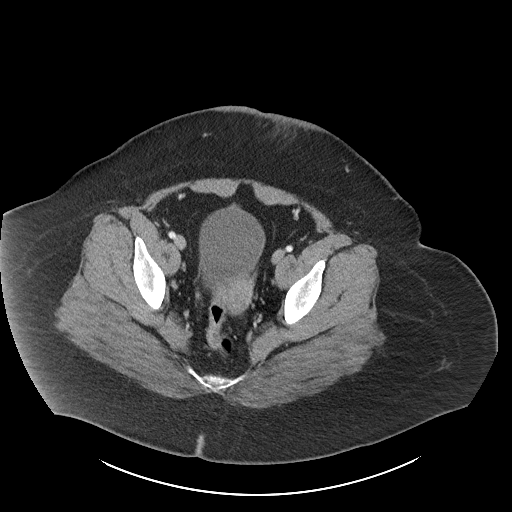
[im 29/99  soft-tissue]
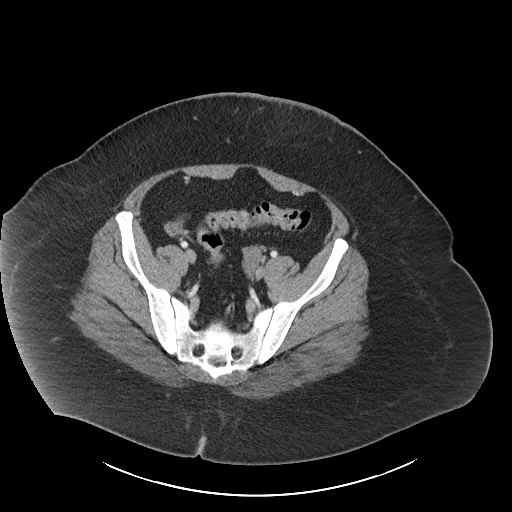
[im 36/99  soft-tissue]
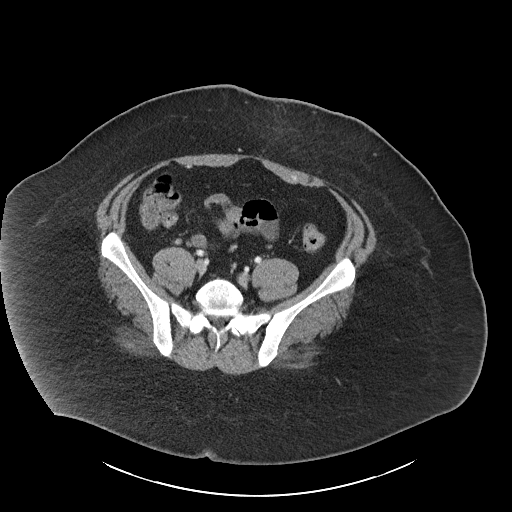
[im 43/99  soft-tissue]
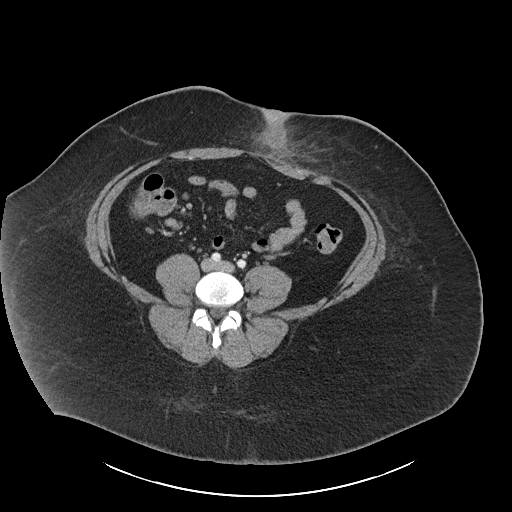
[im 50/99  soft-tissue]
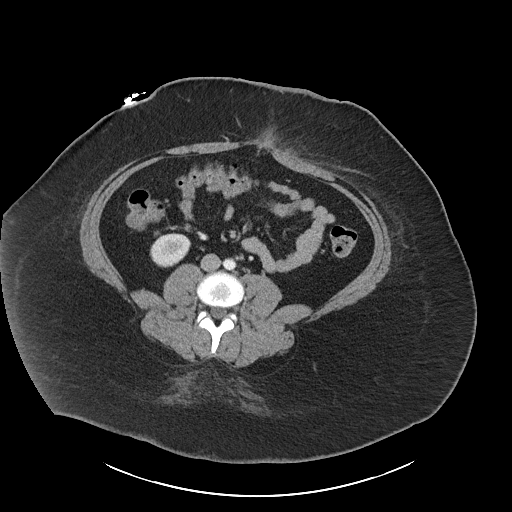
[im 57/99  soft-tissue]
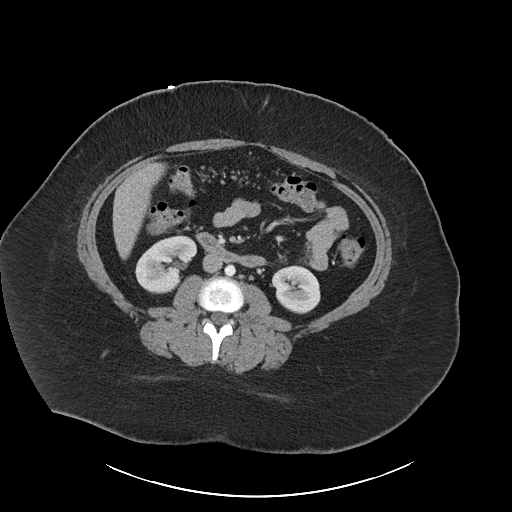
[im 64/99  soft-tissue]
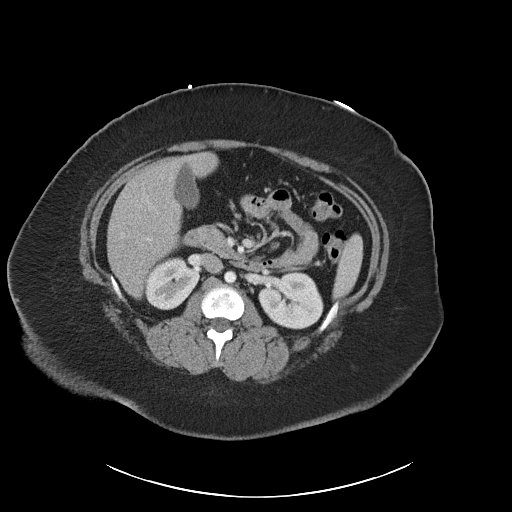
[im 64/99  bone]
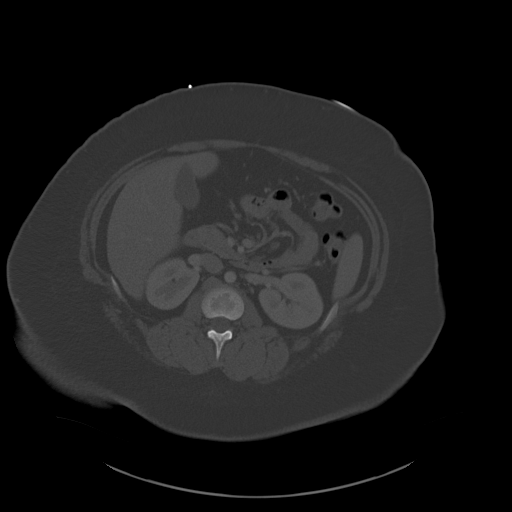
[im 71/99  soft-tissue]
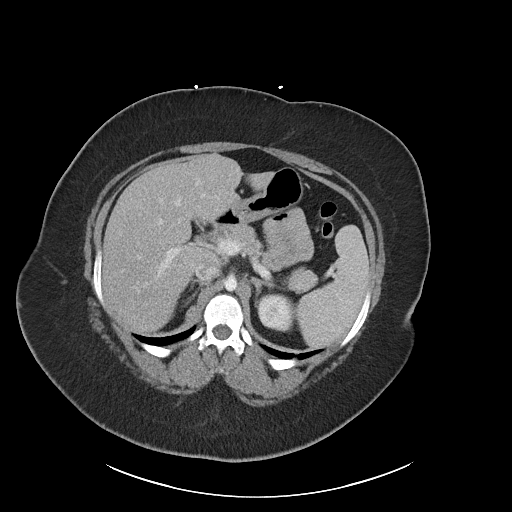
[im 78/99  soft-tissue]
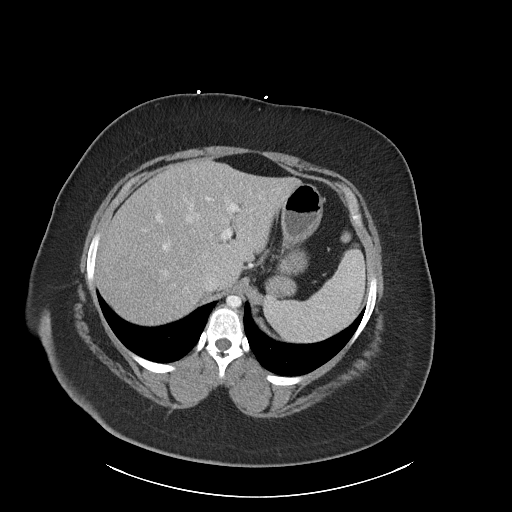
[im 85/99  soft-tissue]
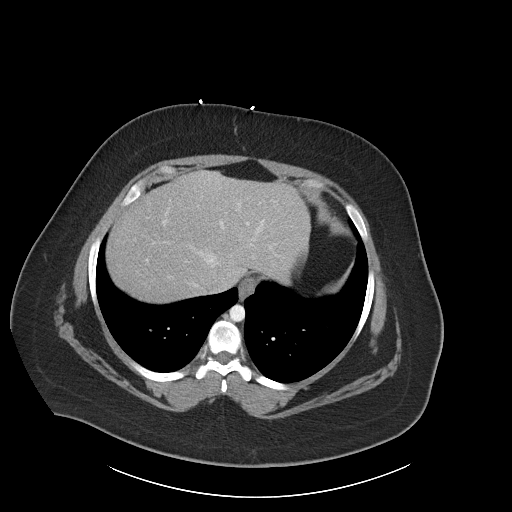
[im 92/99  soft-tissue]
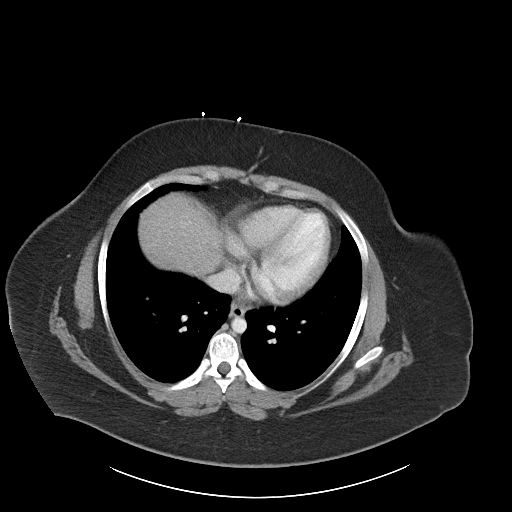

[Series 6: abdomen 3.0 mpr cor · coronal · 0.71mm/px · 3 of 115 slices shown]
[im 39/115  soft-tissue]
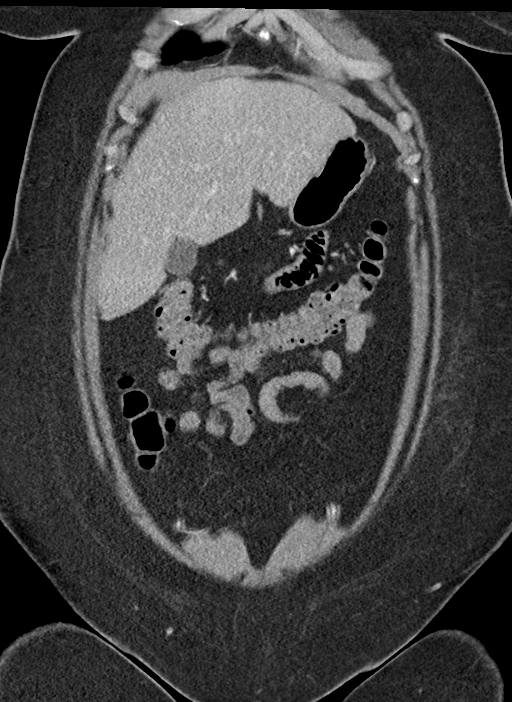
[im 51/115  soft-tissue]
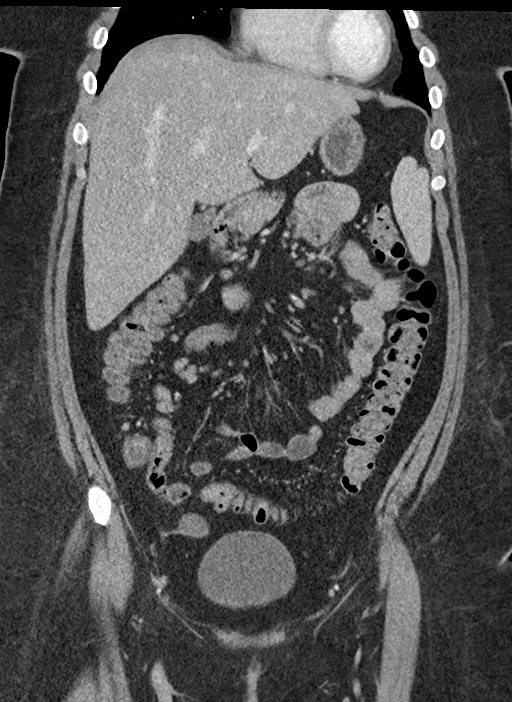
[im 64/115  soft-tissue]
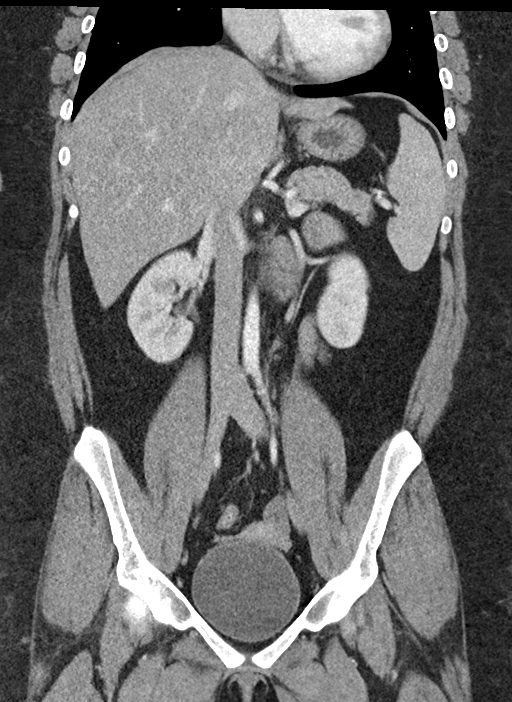

[16 of 46 positions shown; findings below may reference images not displayed]

FINDINGS: Lower chest: Lung bases are clear.

Hepatobiliary: No focal hepatic lesion. No biliary duct dilatation.
Common bile duct is normal.

Pancreas: Pancreas is normal. No ductal dilatation. No pancreatic
inflammation.

Spleen: Normal spleen

Adrenals/urinary tract: Adrenal glands and kidneys are normal. The
ureters and bladder normal.

Stomach/Bowel: Stomach, small bowel, appendix, and cecum are normal.
The colon and rectosigmoid colon are normal.

Vascular/Lymphatic: Abdominal aorta is normal caliber. No periportal
or retroperitoneal adenopathy. No pelvic adenopathy.

Reproductive:

Other: Tubular fluid collection positioned between the umbilicus and
the ventral abdominal wall fascial layer. Collection measures 3.8 x
3.7 cm in sagittal mention (image 66/7) and 2.9 cm in axial
dimension (image 54/3). Enhancement along the borders of this fluid
collection. The fluid collection approximates the Thijn Nota and
LEFT rectus muscle but does not appear to extend into the peritoneal
space.

Musculoskeletal: No aggressive osseous lesion.
IMPRESSION: Subcutaneous abscess position between the umbilicus and ventral
abdominal wall fascia layer without evidence of extension into the
peritoneal space.

## 2021-08-07 ENCOUNTER — Other Ambulatory Visit: Payer: Self-pay

## 2021-08-07 ENCOUNTER — Ambulatory Visit
Admission: RE | Admit: 2021-08-07 | Discharge: 2021-08-07 | Disposition: A | Discharge status: Home / Self Care | Payer: Medicaid Other | Source: Ambulatory Visit | Attending: Otolaryngology | Admitting: Otolaryngology

## 2021-08-07 DIAGNOSIS — J3489 Other specified disorders of nose and nasal sinuses: Secondary | ICD-10-CM | POA: Diagnosis not present

## 2021-08-07 DIAGNOSIS — E890 Postprocedural hypothyroidism: Secondary | ICD-10-CM | POA: Diagnosis not present

## 2021-08-07 DIAGNOSIS — L988 Other specified disorders of the skin and subcutaneous tissue: Secondary | ICD-10-CM

## 2021-08-07 MED ORDER — IOPAMIDOL (ISOVUE-300) INJECTION 61%
75.0000 mL | Freq: Once | INTRAVENOUS | Status: AC | PRN
  Administered 2021-08-07: 75 mL via INTRAVENOUS

## 2021-08-29 DIAGNOSIS — F902 Attention-deficit hyperactivity disorder, combined type: Secondary | ICD-10-CM | POA: Diagnosis not present

## 2021-08-29 DIAGNOSIS — F0630 Mood disorder due to known physiological condition, unspecified: Secondary | ICD-10-CM | POA: Diagnosis not present

## 2021-08-29 DIAGNOSIS — F3481 Disruptive mood dysregulation disorder: Secondary | ICD-10-CM | POA: Diagnosis not present

## 2021-09-20 DIAGNOSIS — L988 Other specified disorders of the skin and subcutaneous tissue: Secondary | ICD-10-CM | POA: Diagnosis not present

## 2022-01-14 ENCOUNTER — Telehealth (INDEPENDENT_AMBULATORY_CARE_PROVIDER_SITE_OTHER): Payer: Self-pay

## 2022-01-14 NOTE — Telephone Encounter (Signed)
Received documentation from [facility] stating that they have made a final attempt to reach [place] about this pts supprelin and if the pt needed another prescription. I attempted to call the pt but the phone kept ringing. I have also sent a MyChart message to the pt asking them to let us know if they need care from our office anymore and to kindly respond to my message. Will update as needed.

## 2022-01-27 ENCOUNTER — Encounter: Payer: Self-pay | Admitting: Pediatrics

## 2022-07-01 ENCOUNTER — Other Ambulatory Visit (INDEPENDENT_AMBULATORY_CARE_PROVIDER_SITE_OTHER): Payer: Self-pay | Admitting: Pediatric Endocrinology

## 2022-07-17 ENCOUNTER — Encounter (INDEPENDENT_AMBULATORY_CARE_PROVIDER_SITE_OTHER): Payer: Self-pay

## 2022-07-24 ENCOUNTER — Telehealth (INDEPENDENT_AMBULATORY_CARE_PROVIDER_SITE_OTHER): Payer: Self-pay | Admitting: Pediatric Endocrinology

## 2022-07-24 NOTE — Telephone Encounter (Signed)
  Name of who is calling: [name]  Caller's Relationship to Patient:   Best contact number: [phone]  Provider they see: Baldo Ash  Reason for call: Patient is completely out of synthroid. Her primary care doctor is not able to send in the medication at the moment and needs it as soon as possible.      PRESCRIPTION REFILL ONLY  Name of prescription: Synthroid   Pharmacy: [address] [facility] [address]

## 2022-07-28 NOTE — Telephone Encounter (Signed)
Tried calling pt again. Her father picked up. I asked him to have [name] call me back and I gave him my name and our office number for her to speak to me.  He said he would give it to her.

## 2022-07-28 NOTE — Telephone Encounter (Signed)
[name] called back. Told her we hadn't seen her since June 2022. And that legally we can't prescribe anything to her without being seen. Pt was a bit frantic hearing the news, I recall her mentioning something like she needs the medication or she might die.   I conversed with Dr Baldo Ash regarding her issue, and we are not able to fill this medication, and since she will be seen by her PCP in the coming weeks, they should be able to fill it for her. She hung up.

## 2022-10-20 ENCOUNTER — Encounter (INDEPENDENT_AMBULATORY_CARE_PROVIDER_SITE_OTHER): Payer: Self-pay

## 2022-11-20 DIAGNOSIS — F840 Autistic disorder: Secondary | ICD-10-CM | POA: Insufficient documentation

## 2022-12-19 ENCOUNTER — Encounter (INDEPENDENT_AMBULATORY_CARE_PROVIDER_SITE_OTHER): Payer: Self-pay

## 2023-02-24 ENCOUNTER — Encounter: Payer: Self-pay | Admitting: Pediatrics

## 2023-12-17 ENCOUNTER — Ambulatory Visit: Admitting: Family Medicine

## 2023-12-17 VITALS — BP 112/76 | HR 83 | Ht 64.0 in | Wt 326.0 lb

## 2023-12-17 DIAGNOSIS — Q7962 Hypermobile Ehlers-Danlos syndrome: Secondary | ICD-10-CM

## 2023-12-17 DIAGNOSIS — M2550 Pain in unspecified joint: Secondary | ICD-10-CM

## 2023-12-17 NOTE — Patient Instructions (Signed)
 Thank you for coming in today.   I've referred you to Physical Therapy.  Let us  know if you don't hear from them in one week.  Check out the book Disjointed Navigating the Diagnosis and Management of Hypermobile Ehlers-Danlos Syndrome and Hypermobility Spectrum Disorders.   Check back in 3 months

## 2023-12-17 NOTE — Progress Notes (Addendum)
   LILLETTE Ileana Collet, PhD, LAT, ATC acting as a scribe for Artist Lloyd, MD.  Heidi Heidi [name] is a 23 y.o. adult who presents to [facility] today for evaluation of her hypermobility. Ongoing since childhood.  They notes a hx of ADHD and autism.  MS: L ankle is particular painful, chronic LBP--- seen by Dr. Daldorf Skin/Immune reactions: bruising easily, soft, rashes Nervous system: dizziness Head/Spine: Chiari malformation, lightheadedness CV: none GI: nausea, easy gag reflex, constipation, diarrhea Genitourinary: dysmenorrhea, prior slightly painful intercourse Hands & Feet: none  Pertinent review of systems: No fevers or chills  Relevant historical information: Migraine headache.  Chiari malformation now fixed with surgery.  Hypothyroidism.  Gender dysphoria. Heidi [name]  identifies as nonbinary and uses mostly the them pronouns. Patient's mother was recently diagnosed with hypermobile Ehlers-Danlos syndrome.  Exam:   BP 112/76   Pulse 83   Ht 5' 4 (1.626 m)   Wt (!) 326 lb (147.9 kg)   SpO2 98%   BMI 55.96 kg/m  General: Well Developed, well nourished, and in no acute distress.   MSK: Hypermobility evaluation: Beighton hypermobility score 7/9. Positive first-degree family member diagnosed with Ehlers-Danlos syndrome. Chronic widespread muscular pain. Patient additionally has 4 of the 12 items including soft velvety skin, mild skin hyperextensibility, bilateral piezogenic papules, dental crowding.   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 Dominant problem is musculoskeletal pain.  Plan to refer to physical therapy.  They may have some component of dysautonomia but that does not seem to be a major problem currently.  They do have commonly seen associated diagnoses including a Chiari malformation which has since been fixed and ADHD autism. Recheck in 3 months. PDMP not reviewed this encounter. Orders Placed  This Encounter  Procedures   Ambulatory referral to Physical Therapy    Referral Priority:   Routine    Referral Type:   Physical Medicine    Referral Reason:   Specialty Services Required    Requested Specialty:   Physical Therapy    Number of Visits Requested:   1   No orders of the defined types were placed in this encounter.    Discussed warning signs or symptoms. Please see discharge instructions. Patient expresses understanding.   The above documentation has been reviewed and is accurate and complete Artist Lloyd, M.D.

## 2024-01-01 ENCOUNTER — Encounter: Payer: Self-pay | Admitting: Advanced Practice Midwife

## 2024-01-12 ENCOUNTER — Telehealth: Payer: Self-pay | Admitting: Surgery

## 2024-01-12 NOTE — Telephone Encounter (Signed)
     Telephone call to the patient regarding a recent new referral that I received regarding her case.  This patient had been evaluated early in 2023.  I had recommended at that time that she see an ENT surgeon who was skilled in nerve monitoring technique to consider scar revision and resection of the residual thyroid  tissue that was left after her thyroid  surgery performed in 2020 by Dr. Delon Elder.  Patient was seen by Dr. Ida Loader.  Patient did not undergo any surgical procedure following his evaluation.  My recommendation remains the same.  The patient will require a surgeon skilled in nerve monitoring technique if she is to undergo resection of any residual thyroid  tissue.  If she only wishes scar revision, then she should be seen by plastic surgery.  I have offered to make referral to either the endocrine surgeons at [facility] in [place], or to the ENT head and neck surgeons at [facility].  Whichever is most convenient for the patient, we will be happy to place the referral.  I have asked the patient to notify us  of her wishes.  Krystal Spinner, MD [facility] Surgery Office: [phone]
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
## 2024-01-20 ENCOUNTER — Ambulatory Visit: Payer: Self-pay | Admitting: Physical Therapy

## 2024-02-03 ENCOUNTER — Emergency Department (HOSPITAL_COMMUNITY)
Admission: EM | Admit: 2024-02-03 | Discharge: 2024-02-03 | Disposition: A | Discharge status: Home / Self Care | Payer: Self-pay | Attending: Emergency Medicine | Admitting: Emergency Medicine

## 2024-02-03 ENCOUNTER — Encounter (HOSPITAL_COMMUNITY): Payer: Self-pay

## 2024-02-03 ENCOUNTER — Emergency Department (HOSPITAL_COMMUNITY): Payer: Self-pay

## 2024-02-03 ENCOUNTER — Other Ambulatory Visit: Payer: Self-pay

## 2024-02-03 DIAGNOSIS — R0789 Other chest pain: Secondary | ICD-10-CM | POA: Insufficient documentation

## 2024-02-03 HISTORY — DX: Hypermobile Ehlers-Danlos syndrome: Q79.62

## 2024-02-03 LAB — CBC
HCT: 47.2 % — ABNORMAL HIGH (ref 36.0–46.0)
Hemoglobin: 15 g/dL (ref 12.0–15.0)
MCH: 30.5 pg (ref 26.0–34.0)
MCHC: 31.8 g/dL (ref 30.0–36.0)
MCV: 95.9 fL (ref 80.0–100.0)
Platelets: 153 K/uL (ref 150–400)
RBC: 4.92 MIL/uL (ref 3.87–5.11)
RDW: 13.5 % (ref 11.5–15.5)
WBC: 10.1 K/uL (ref 4.0–10.5)
nRBC: 0 % (ref 0.0–0.2)

## 2024-02-03 LAB — TROPONIN I (HIGH SENSITIVITY): Troponin I (High Sensitivity): 3 ng/L (ref <18)

## 2024-02-03 LAB — BASIC METABOLIC PANEL WITH GFR
Anion gap: 15 (ref 5–15)
BUN: 12 mg/dL (ref 6–20)
CO2: 15 mmol/L — ABNORMAL LOW (ref 22–32)
Calcium: 8.5 mg/dL — ABNORMAL LOW (ref 8.9–10.3)
Chloride: 109 mmol/L (ref 98–111)
Creatinine, Ser: 0.87 mg/dL (ref 0.44–1.00)
GFR, Estimated: >60 mL/min (ref >60)
Glucose, Bld: 171 mg/dL — ABNORMAL HIGH (ref 70–99)
Potassium: 4.6 mmol/L (ref 3.5–5.1)
Sodium: 139 mmol/L (ref 135–145)

## 2024-02-03 LAB — D-DIMER, QUANTITATIVE: D-Dimer, Quant: 0.28 ug{FEU}/mL (ref 0.00–0.50)

## 2024-02-03 LAB — HCG, SERUM, QUALITATIVE: Preg, Serum: NEGATIVE

## 2024-02-03 NOTE — Discharge Instructions (Signed)
 Your blood work did not show any evidence of heart attack or a blood clot.  Follow-up with your primary care doctor.  Your pain did improve however in the emergency department.  Return for any emergent symptoms.

## 2024-02-03 NOTE — ED Triage Notes (Signed)
 Pt arrived from home via POV c/o mid-sternal chest pain 8/10 on pain scale described as tight.

## 2024-02-03 NOTE — ED Provider Notes (Signed)
 Signout received on this 23 year old female.  See previous note for full details.  Pending troponin and D-dimer at the end of shift change.  Currently chest pain-free. Physical Exam  BP 107/64 (BP Location: Right Arm)   Pulse 80   Temp 99 F (37.2 C) (Oral)   Resp 12   Ht 5' 4 (1.626 m)   Wt (!) 149.7 kg   SpO2 95%   BMI 56.64 kg/m     Procedures  Procedures  ED Course / MDM    Medical Decision Making Amount and/or Complexity of Data Reviewed Labs: ordered. Radiology: ordered.   Repeat troponin and D-dimer are within normal.  Patient discharged in stable condition.       Hildegard Loge, PA-C 02/03/24 0744    Pamella Ozell LABOR, DO 02/08/24 [phone]

## 2024-02-03 NOTE — ED Provider Notes (Signed)
 [facility]-EMERGENCY DEPT [facility] Emergency Department Provider Note MRN:  [id number]  Arrival date & time: 02/03/24     Chief Complaint   Chest Pain   History of Present Illness   Heidi [name] is a 23 y.o. year-old adult presents to the ED with chief complaint of chest pain.  States the pain was central.  Onset was tonight.  States that the symptoms were intermittent.  Denies having associated shortness of breath.  Denies fevers, chills, nausea, vomiting.  Denies any radiating pain.  States that they are pain-free now.  Has a history of hypermobile Ehlers-Danlos syndrome.  Patient states that they are nervous due to family history of heart attack.  History provided by patient.   Review of Systems  Pertinent positive and negative review of systems noted in HPI.    Physical Exam   Vitals:   02/03/24 0528 02/03/24 0618  BP:  107/64  Pulse:  80  Resp:  12  Temp:  99 F (37.2 C)  SpO2: 96% 95%    CONSTITUTIONAL:  anxious-appearing, NAD NEURO:  Alert and oriented x 3, CN 3-12 grossly intact EYES:  eyes equal and reactive ENT/NECK:  Supple, no stridor  CARDIO:  normal rate, regular rhythm, appears well-perfused  PULM:  No respiratory distress, CTAB GI/GU:  non-distended,  MSK/SPINE:  No gross deformities, no edema, moves all extremities  SKIN:  no rash, atraumatic   *Additional and/or pertinent findings included in MDM below  Diagnostic and Interventional Summary    EKG Interpretation Date/Time:    Ventricular Rate:    PR Interval:    QRS Duration:    QT Interval:    QTC Calculation:   R Axis:      Text Interpretation:         Labs Reviewed  BASIC METABOLIC PANEL WITH GFR - Abnormal; Notable for the following components:      Result Value   CO2 15 (*)    Glucose, Bld 171 (*)    Calcium  8.5 (*)    All other components within normal limits  CBC - Abnormal; Notable for the following components:   HCT 47.2 (*)    All other components within normal  limits  HCG, SERUM, QUALITATIVE  D-DIMER, QUANTITATIVE (NOT AT [facility])  TROPONIN I (HIGH SENSITIVITY)    DG Chest 2 View  Final Result      Medications - No data to display   Procedures  /  Critical Care Procedures  ED Course and Medical Decision Making  I have reviewed the triage vital signs, the nursing notes, and pertinent available records from the EMR.  Social Determinants Affecting Complexity of Care: Patient has no clinically significant social determinants affecting this chief complaint..   ED Course:    Medical Decision Making Here with central chest pain that started after going to bed.  Patient states that her chest feels tight.  She states that her symptoms were intermittent and have now resolved.  Labs and imaging ordered in triage are pending.  Patient does seem quite anxious at the bedside, could be a component of anxiety, but she does have family history of heart disease.  Also has history of EDS.  Due to chest tightness and tachycardia, D-dimer ordered.  Troponin pending.  EKG without acute ischemic changes.  Chest x-ray reassuring.  Amount and/or Complexity of Data Reviewed Labs: ordered. Radiology: ordered.         Consultants:    Treatment and Plan: Patient signed out to oncoming team  who will continue care.    Final Clinical Impressions(s) / ED Diagnoses  No diagnosis found.  ED Discharge Orders     None         Discharge Instructions Discussed with and Provided to Patient:   Discharge Instructions   None      Vicky Charleston, PA-C 02/03/24 9365    Jerral Meth, MD 02/04/24 [phone]

## 2024-02-10 ENCOUNTER — Ambulatory Visit: Payer: Self-pay | Admitting: Physical Therapy
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
## 2024-02-11 ENCOUNTER — Institutional Professional Consult (permissible substitution) (INDEPENDENT_AMBULATORY_CARE_PROVIDER_SITE_OTHER): Admitting: Otolaryngology

## 2024-03-17 ENCOUNTER — Ambulatory Visit: Admitting: Family Medicine

## 2024-03-17 VITALS — BP 112/76 | HR 83 | Ht 64.0 in | Wt 324.0 lb

## 2024-03-17 DIAGNOSIS — M25562 Pain in left knee: Secondary | ICD-10-CM

## 2024-03-17 DIAGNOSIS — Q7962 Hypermobile Ehlers-Danlos syndrome: Secondary | ICD-10-CM | POA: Diagnosis not present

## 2024-03-17 DIAGNOSIS — G8929 Other chronic pain: Secondary | ICD-10-CM

## 2024-03-17 NOTE — Patient Instructions (Addendum)
Thank you for coming in today.   Check back in 1 month 

## 2024-03-17 NOTE — Progress Notes (Signed)
   LILLETTE Ileana Collet, PhD, LAT, ATC acting as a scribe for Artist Lloyd, MD.  Heidi [name] is a 23 y.o. adult who presents to [facility] today for 77-month f/u hEDS. Pt was last seen by Dr. Lloyd on 12/17/23 and was referred to PT, but her 1st visit was canceled do to insurance coverage issues.  Today, pt reports 1st PT visit is scheduled for next wk. Insurance issue has been fixed. L knee and L ankle are particularly painful today. Her back pain remains about the same, 6/10.  Pertinent review of systems: No fevers or chills  Relevant historical information: Ehlers-Danlos and Chiari malformation.   Exam:  BP 112/76   Pulse 83   Ht 5' 4 (1.626 m)   Wt (!) 324 lb (147 kg)   SpO2 98%   BMI 55.61 kg/m  General: Well Developed, well nourished, and in no acute distress.   MSK: Left knee normal-appearing normal motion.  Intact strength.       Assessment and Plan: 23 y.o. adult with hypermobility and Ehlers-Danlos.  Patient has new left knee pain today.  Pain due predominantly to patellofemoral pain syndrome.  She is scheduled to start physical therapy next week which should be quite helpful.  Recommend Voltaren  gel.  Consider Kinesiotape.  Check back in 1 month.   PDMP not reviewed this encounter. No orders of the defined types were placed in this encounter.  No orders of the defined types were placed in this encounter.    Discussed warning signs or symptoms. Please see discharge instructions. Patient expresses understanding.   The above documentation has been reviewed and is accurate and complete Artist Lloyd, M.D.

## 2024-03-23 ENCOUNTER — Ambulatory Visit: Payer: Self-pay | Attending: Family Medicine

## 2024-03-23 ENCOUNTER — Other Ambulatory Visit: Payer: Self-pay

## 2024-03-23 DIAGNOSIS — M2550 Pain in unspecified joint: Secondary | ICD-10-CM | POA: Diagnosis present

## 2024-03-23 DIAGNOSIS — M25572 Pain in left ankle and joints of left foot: Secondary | ICD-10-CM | POA: Diagnosis present

## 2024-03-23 DIAGNOSIS — M5459 Other low back pain: Secondary | ICD-10-CM | POA: Insufficient documentation
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
## 2024-03-28 ENCOUNTER — Ambulatory Visit: Attending: Cardiology | Admitting: Cardiology

## 2024-03-28 ENCOUNTER — Encounter: Payer: Self-pay | Admitting: Cardiology

## 2024-03-28 ENCOUNTER — Other Ambulatory Visit (HOSPITAL_COMMUNITY): Payer: Self-pay

## 2024-03-28 VITALS — BP 94/58 | HR 94 | Ht 64.0 in

## 2024-03-28 DIAGNOSIS — R072 Precordial pain: Secondary | ICD-10-CM | POA: Insufficient documentation

## 2024-03-28 DIAGNOSIS — E782 Mixed hyperlipidemia: Secondary | ICD-10-CM | POA: Diagnosis present

## 2024-03-28 MED ORDER — IVABRADINE HCL 5 MG PO TABS
ORAL_TABLET | ORAL | 0 refills | Status: DC
  Filled 2024-03-28: qty 2, 1d supply, fill #0

## 2024-03-28 NOTE — Patient Instructions (Addendum)
 Lab Work: [employer]  If you have labs (blood work) drawn today and your tests are completely normal, you will receive your results only by: MyChart Message (if you have MyChart) OR A paper copy in the mail If you have any lab test that is abnormal or we need to change your treatment, we will call you to review the results.  Testing/Procedures: Coronary cta  Your physician has requested that you have cardiac CT. Cardiac computed tomography (CT) is a painless test that uses an x-ray machine to take clear, detailed pictures of your heart. For further information please visit [email or web address] Please follow instruction sheet as given.    Follow-Up: At [facility], you and your health needs are our priority.  As part of our continuing mission to provide you with exceptional heart care, our providers are all part of one team.  This team includes your primary Cardiologist (physician) and Advanced Practice Providers or APPs (Physician Assistants and Nurse Practitioners) who all work together to provide you with the care you need, when you need it.  Your next appointment:   AS NEEDED  Provider:   Newman JINNY Lawrence, MD         Your cardiac CT will be scheduled at one of the below locations:   [facility] [address] [place], [place] [address] [phone] (Severe contrast allergies only)  OR   [facility] [address] [place], [place] [address] [phone]  OR   [facility] [address] [place], [place] [address] [phone]  OR   [facility] V[facility] Tower [address]  [place], [place] [address] [phone]  OR   [facility] [address] [place], [place] [phone]  If scheduled at [facility], please arrive at the [facility] and Children's Entrance (Entrance C2) of [facility] 30 minutes prior to test start time. You can use the FREE valet parking  offered at entrance C (encouraged to control the heart rate for the test)  Proceed to the [facility] Radiology Department (first floor) to check-in and test prep.  All radiology patients and guests should use entrance C2 at [facility], accessed from [facility], even though the hospital's physical address listed is [address].  If scheduled at the Heart and Vascular Tower at [address], please enter the parking lot using the [address] entrance and use the FREE valet service at the patient drop-off area. Enter the building and check-in with registration on the main floor.  If scheduled at [facility], please arrive to the Heart and Vascular Center 15 mins early for check-in and test prep.  There is spacious parking and easy access to the radiology department from the [facility] Heart and Vascular entrance. Please enter here and check-in with the desk attendant.   If scheduled at [facility], please arrive 30 minutes early for check-in and test prep.  Please follow these instructions carefully (unless otherwise directed):  An IV will be required for this test and Nitroglycerin will be given.  Hold all erectile dysfunction medications at least 3 days (72 hrs) prior to test. (Ie viagra, cialis, sildenafil, tadalafil, etc)   On the Night Before the Test: Be sure to Drink plenty of water. Do not consume any caffeinated/decaffeinated beverages or chocolate 12 hours prior to your test. Do not take any antihistamines 12 hours prior to your test.    On  the Day of the Test: Drink plenty of water until 1 hour prior to the test. Do not eat any food 1 hour prior to test. You may take your regular medications prior to the test.  Take Ivabradine two hours prior to test. If you take Furosemide/Hydrochlorothiazide/Spironolactone/Chlorthalidone, please HOLD on the morning of the test. Patients who wear a continuous glucose monitor MUST  remove the device prior to scanning. FEMALES- please wear underwire-free bra if available, avoid dresses & tight clothing         After the Test: Drink plenty of water. After receiving IV contrast, you may experience a mild flushed feeling. This is normal. On occasion, you may experience a mild rash up to 24 hours after the test. This is not dangerous. If this occurs, you can take Benadryl  25 mg, Zyrtec , Claritin , or Allegra and increase your fluid intake. (Patients taking Tikosyn should avoid Benadryl , and may take Zyrtec , Claritin , or Allegra) If you experience trouble breathing, this can be serious. If it is severe call 911 IMMEDIATELY. If it is mild, please call our office.  We will call to schedule your test 2-4 weeks out understanding that some insurance companies will need an authorization prior to the service being performed.   For more information and frequently asked questions, please visit our website : [email or web address]  For non-scheduling related questions, please contact the cardiac imaging nurse navigator should you have any questions/concerns: Cardiac Imaging Nurse Navigators Direct Office Dial: [phone]   For scheduling needs, including cancellations and rescheduling, please call [place], [phone].

## 2024-03-28 NOTE — Progress Notes (Signed)
 Cardiology Office Note:  .   Date:  03/28/2024  ID:  Lacinda Hutchinson, DOB 2000-11-22, MRN [id number] PCP: [facility]   [facility] Providers Cardiologist:  Newman Lawrence, MD PCP: [place]   Chief Complaint  Patient presents with   Chest Pain   New Patient (Initial Visit)     Aundria Bitterman is a 23 y.o. adult with Ehlers-Danlos syndrome, obesity, ADHD, mixed hyperlipidemia, chest pain  Discussed the use of AI scribe software for clinical note transcription with the patient, who gave verbal consent to proceed.  History of Present Illness Lauran Romanski [name] is a 23 year old with Ehlers-Danlos syndrome and dysautonomia who presents with recurrent chest pain.  [name] experiences recurrent episodes of chest pain, primarily between 2:00 to 2:30 AM. The pain starts as mild discomfort in the lower left lateral rib cage, radiating towards the spine and chest, becoming excruciating. Each episode lasts about 30 minutes, transitioning from constant to stabbing or pulsing pain before subsiding. These episodes occur while lying down and are not associated with physical activity. They wake up feeling fine after these episodes and do not experience chest pain with physical activity.  Their blood pressure typically ranges from 120/80 to 110/70, occasionally dropping to 90/50. They have a cholesterol level of 255 mg/dL. There is a family history of heart disease; their father has had three heart attacks and a history of diabetes.  They have Ehlers-Danlos syndrome, contributing to joint hypermobility and chronic pain, and dysautonomia. They also have mast cell activation syndrome.      Vitals:   03/28/24 1504  Pulse: 94  SpO2: 96%      Review of Systems  Cardiovascular:  Positive for chest pain. Negative for dyspnea on exertion, leg swelling, palpitations and syncope.        Studies Reviewed: SABRA        EKG  03/28/2024: Normal sinus rhythm Normal ECG When compared with ECG of 03-Feb-2024 05:06, Minimal criteria for Inferior infarct are no longer Present  Labs 01/2024: Chol 255, TG 216, HDL 55, LDL 161 Hb 15 Cr 0.87 Trop HS 3  2022: HbA1C 5.8%  Physical Exam Vitals and nursing note reviewed.  Constitutional:      General: [name] is not in acute distress.    Appearance: [name] is obese.  Neck:     Vascular: No JVD.  Cardiovascular:     Rate and Rhythm: Normal rate and regular rhythm.     Heart sounds: Normal heart sounds. No murmur heard. Pulmonary:     Effort: Pulmonary effort is normal.     Breath sounds: Normal breath sounds. No wheezing or rales.      VISIT DIAGNOSES:   ICD-10-CM   1. Precordial pain  R07.2 EKG 12-Lead    Basic metabolic panel with GFR    CT CORONARY MORPH W/CTA COR W/SCORE W/CA W/CM &/OR WO/CM    2. Mixed hyperlipidemia  E78.2        Imya Mance is a 23 y.o. adult with Ehlers-Danlos syndrome, obesity, ADHD, mixed hyperlipidemia, chest pain  Assessment & Plan Recurrent chest pain episodes: Intermittent, severe nocturnal chest pain radiating from the lower left lateral rib cage to the spine and chest. Differential includes anxiety-related pain or Ehlers-Danlos syndrome. Low likelihood of coronary artery disease. Coronary CT angiogram preferred due to potential poor image quality with chemical stress test. - Order coronary CT angiogram. - Discuss dietary changes to manage cholesterol, including reducing  egg yolks and processed foods. - Encourage intermittent fasting and calorie counting for weight and cholesterol management.  Mixed hyperlipidermia: Elevated cholesterol with total cholesterol 255 mg/dL, LDL 839 mg/dL. Statin therapy dependent on coronary CT angiogram results. Dietary changes recommended, financial constraints noted. - Order coronary CT angiogram to assess statin therapy need. - Recommend dietary changes to reduce cholesterol, including  limiting egg yolks and processed foods. - Encourage calorie counting and intermittent fasting for cholesterol management.    Meds ordered this encounter  Medications   ivabradine (CORLANOR) 5 MG TABS tablet    Sig: Take 2 tablets by mouth 2 hours prior to CT Scan    Dispense:  2 tablet    Refill:  0     F/u as needed  Signed, Newman JINNY Lawrence, MD

## 2024-03-29 LAB — BASIC METABOLIC PANEL WITH GFR
BUN/Creatinine Ratio: 16 (ref 9–23)
BUN: 15 mg/dL (ref 6–20)
CO2: 23 mmol/L (ref 20–29)
Calcium: 9.5 mg/dL (ref 8.7–10.2)
Chloride: 103 mmol/L (ref 96–106)
Creatinine, Ser: 0.92 mg/dL (ref 0.57–1.00)
Glucose: 89 mg/dL (ref 70–99)
Potassium: 4.3 mmol/L (ref 3.5–5.2)
Sodium: 139 mmol/L (ref 134–144)
eGFR: 90 mL/min/1.73 (ref >59)

## 2024-04-06 ENCOUNTER — Ambulatory Visit

## 2024-04-06 DIAGNOSIS — M2550 Pain in unspecified joint: Secondary | ICD-10-CM

## 2024-04-06 DIAGNOSIS — M5459 Other low back pain: Secondary | ICD-10-CM

## 2024-04-06 DIAGNOSIS — M25572 Pain in left ankle and joints of left foot: Secondary | ICD-10-CM
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
## 2024-04-07 ENCOUNTER — Institutional Professional Consult (permissible substitution) (INDEPENDENT_AMBULATORY_CARE_PROVIDER_SITE_OTHER): Admitting: Otolaryngology

## 2024-04-08 ENCOUNTER — Ambulatory Visit

## 2024-04-08 DIAGNOSIS — M2550 Pain in unspecified joint: Secondary | ICD-10-CM

## 2024-04-08 DIAGNOSIS — M5459 Other low back pain: Secondary | ICD-10-CM

## 2024-04-08 DIAGNOSIS — M25572 Pain in left ankle and joints of left foot: Secondary | ICD-10-CM
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
## 2024-04-13 ENCOUNTER — Ambulatory Visit

## 2024-04-13 DIAGNOSIS — M5459 Other low back pain: Secondary | ICD-10-CM

## 2024-04-13 DIAGNOSIS — M2550 Pain in unspecified joint: Secondary | ICD-10-CM

## 2024-04-13 DIAGNOSIS — M25572 Pain in left ankle and joints of left foot: Secondary | ICD-10-CM
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
## 2024-04-14 ENCOUNTER — Institutional Professional Consult (permissible substitution) (INDEPENDENT_AMBULATORY_CARE_PROVIDER_SITE_OTHER): Admitting: Otolaryngology

## 2024-04-15 ENCOUNTER — Ambulatory Visit

## 2024-04-15 DIAGNOSIS — M2550 Pain in unspecified joint: Secondary | ICD-10-CM

## 2024-04-15 DIAGNOSIS — M5459 Other low back pain: Secondary | ICD-10-CM

## 2024-04-15 DIAGNOSIS — M25572 Pain in left ankle and joints of left foot: Secondary | ICD-10-CM
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
## 2024-04-18 ENCOUNTER — Ambulatory Visit (INDEPENDENT_AMBULATORY_CARE_PROVIDER_SITE_OTHER): Admitting: Family Medicine

## 2024-04-18 VITALS — BP 100/60 | HR 88 | Ht 64.0 in | Wt 333.0 lb

## 2024-04-18 DIAGNOSIS — Q7962 Hypermobile Ehlers-Danlos syndrome: Secondary | ICD-10-CM | POA: Diagnosis not present

## 2024-04-18 DIAGNOSIS — Z789 Other specified health status: Secondary | ICD-10-CM | POA: Diagnosis not present

## 2024-04-18 DIAGNOSIS — M25562 Pain in left knee: Secondary | ICD-10-CM

## 2024-04-18 DIAGNOSIS — G8929 Other chronic pain: Secondary | ICD-10-CM

## 2024-04-18 NOTE — Progress Notes (Signed)
   I, Claretha Schimke am a scribe for Dr. Artist Lloyd, MD.  Harjot Zavadil is a 23 y.o. adult who presents to [facility] at [facility] today for 69-month f/u hEDS and L knee pain. Pt was last seen by Dr. Lloyd on 03/17/24 and was advised to use Voltaren  gel and consider kinesiotape. Pt cont'd PT, completing 5 visits.  Today, pt reports ankle is sore, back is chronically hurting. Emotionally/ mentally  just don't know. Left knee is really good today.   They notes that she is going to have challenges with transportation in the near future.  They are mom has been diagnosed with esophageal carcinoma and is scheduled to start chemotherapy this week.  They relies on their mother for transportation. [name] has a diagnosis of autism and is unable to drive.  Pertinent review of systems: No fevers or chills  Relevant historical information: Ehlers-Danlos and Chiari malformation.   Exam:  BP 100/60   Pulse 88   Ht 5' 4 (1.626 m)   Wt (!) 333 lb (151 kg)   SpO2 93%   BMI 57.16 kg/m  General: Well Developed, well nourished, and in no acute distress.   MSK: Normal gait and motion     Assessment and Plan: 23 y.o. adult with hypermobile Ehlers-Danlos syndrome treated predominantly with physical therapy and home exercise program.  Overall treatments going pretty well however [name] is about to lose transportation.  We discussed options and will fill out paperwork for access to go transportation here in [place] to allow [name] to attend medical visits and physical therapy visits. Patient will keep me updated.  PDMP not reviewed this encounter. No orders of the defined types were placed in this encounter.  No orders of the defined types were placed in this encounter.    Discussed warning signs or symptoms. Please see discharge instructions. Patient expresses understanding.   The above documentation has been reviewed and is accurate and complete Artist Lloyd, M.D.

## 2024-04-19 ENCOUNTER — Telehealth: Payer: Self-pay

## 2024-04-19 NOTE — Telephone Encounter (Signed)
 Form completed, placed on Dr. Zollie Pee desk to review and sign.

## 2024-04-19 NOTE — Telephone Encounter (Signed)
 Form reviewed and signed by Dr. Denyse Amass, placed at the front desk for faxing/scanning.

## 2024-04-19 NOTE — Telephone Encounter (Signed)
 Transportation form provided at yesterdays visit.   Will work on form completion.

## 2024-04-20 ENCOUNTER — Ambulatory Visit

## 2024-04-20 ENCOUNTER — Institutional Professional Consult (permissible substitution) (INDEPENDENT_AMBULATORY_CARE_PROVIDER_SITE_OTHER)

## 2024-04-22 ENCOUNTER — Encounter

## 2024-06-10 ENCOUNTER — Telehealth (HOSPITAL_COMMUNITY): Payer: Self-pay | Admitting: Emergency Medicine

## 2024-06-10 NOTE — Telephone Encounter (Signed)
 Reaching out to patient to offer assistance regarding upcoming cardiac imaging study; pt verbalizes understanding of appt date/time, parking situation and where to check in, pre-test NPO status and medications ordered, and verified current allergies; name and call back number provided for further questions should they arise Camie Shutter RN Navigator Cardiac Imaging [facility] [phone] office [phone] cell  Reminded to pick up ivabradine  for CCTA monday

## 2024-06-13 ENCOUNTER — Other Ambulatory Visit (HOSPITAL_COMMUNITY): Payer: Self-pay | Admitting: *Deleted

## 2024-06-13 ENCOUNTER — Ambulatory Visit (HOSPITAL_COMMUNITY)

## 2024-06-13 MED ORDER — IVABRADINE HCL 5 MG PO TABS: ORAL_TABLET | ORAL | 0 refills | Status: AC

## 2024-06-23 ENCOUNTER — Ambulatory Visit (HOSPITAL_COMMUNITY): Admission: RE | Admit: 2024-06-23 | Source: Ambulatory Visit

## 2024-06-28 ENCOUNTER — Telehealth (HOSPITAL_COMMUNITY): Payer: Self-pay | Admitting: Emergency Medicine

## 2024-06-28 NOTE — Telephone Encounter (Signed)
 Attempted to call patient regarding upcoming cardiac CT appointment. Left message on voicemail with name and callback number Rockwell Alexandria RN Navigator Cardiac Imaging [facility] [phone] Office [phone] Cell

## 2024-06-29 ENCOUNTER — Ambulatory Visit (HOSPITAL_COMMUNITY): Admission: RE | Admit: 2024-06-29 | Attending: Cardiology

## 2024-06-30 ENCOUNTER — Other Ambulatory Visit (HOSPITAL_COMMUNITY)
